# Patient Record
Sex: Female | Born: 1945 | Race: White | Hispanic: No | Marital: Single
Health system: Southern US, Community
[De-identification: ages and names within clinical notes are randomized; demographics above are authoritative.]

---

## 2011-11-13 ENCOUNTER — Other Ambulatory Visit (HOSPITAL_COMMUNITY): Payer: Self-pay | Admitting: Internal Medicine

## 2011-11-13 ENCOUNTER — Ambulatory Visit (HOSPITAL_COMMUNITY)
Admission: RE | Admit: 2011-11-13 | Discharge: 2011-11-13 | Disposition: A | Discharge status: Home / Self Care | Payer: Medicare Other | Source: Ambulatory Visit | Attending: Internal Medicine | Admitting: Internal Medicine

## 2011-11-13 VITALS — BP 98/70 | HR 106 | Temp 98.6°F | Resp 20

## 2011-11-13 DIAGNOSIS — K632 Fistula of intestine: Secondary | ICD-10-CM

## 2011-11-13 DIAGNOSIS — C539 Malignant neoplasm of cervix uteri, unspecified: Secondary | ICD-10-CM | POA: Insufficient documentation

## 2011-11-13 DIAGNOSIS — N1330 Unspecified hydronephrosis: Secondary | ICD-10-CM | POA: Insufficient documentation

## 2011-11-13 DIAGNOSIS — N135 Crossing vessel and stricture of ureter without hydronephrosis: Secondary | ICD-10-CM | POA: Insufficient documentation

## 2011-11-13 DIAGNOSIS — C801 Malignant (primary) neoplasm, unspecified: Secondary | ICD-10-CM | POA: Insufficient documentation

## 2011-11-13 LAB — GLUCOSE, CAPILLARY
Glucose-Capillary: 72 mg/dL (ref 70–99)
Glucose-Capillary: 74 mg/dL (ref 70–99)

## 2011-11-13 MED ORDER — MIDAZOLAM HCL 5 MG/5ML IJ SOLN
INTRAMUSCULAR | Status: AC | PRN
  Administered 2011-11-13: 0.5 mg via INTRAVENOUS
  Administered 2011-11-13: 1 mg via INTRAVENOUS
  Administered 2011-11-13 (×2): 0.5 mg via INTRAVENOUS
  Administered 2011-11-13 (×2): 1 mg via INTRAVENOUS

## 2011-11-13 MED ORDER — FENTANYL CITRATE 0.05 MG/ML IJ SOLN
INTRAMUSCULAR | Status: AC | PRN
  Administered 2011-11-13: 25 ug via INTRAVENOUS
  Administered 2011-11-13: 12.5 ug via INTRAVENOUS
  Administered 2011-11-13: 25 ug via INTRAVENOUS
  Administered 2011-11-13: 12.5 ug via INTRAVENOUS
  Administered 2011-11-13: 25 ug via INTRAVENOUS

## 2011-11-13 MED ORDER — HYDROMORPHONE HCL PF 1 MG/ML IJ SOLN
INTRAMUSCULAR | Status: AC | PRN
  Administered 2011-11-13: 1 mg

## 2011-11-13 MED ORDER — DEXTROSE 5 % IV SOLN
INTRAVENOUS | Status: DC
  Administered 2011-11-13: 12:00:00 via INTRAVENOUS

## 2011-11-13 MED ORDER — HYDROMORPHONE HCL PF 1 MG/ML IJ SOLN
INTRAMUSCULAR | Status: AC
  Filled 2011-11-13: qty 1

## 2011-11-13 MED ORDER — MIDAZOLAM HCL 2 MG/2ML IJ SOLN
INTRAMUSCULAR | Status: AC
  Filled 2011-11-13: qty 4

## 2011-11-13 MED ORDER — DEXTROSE-NACL 5-0.45 % IV SOLN: INTRAVENOUS | Status: DC

## 2011-11-13 MED ORDER — SODIUM CHLORIDE 0.9 % IV SOLN
1250.0000 mg | INTRAVENOUS | Status: AC
  Administered 2011-11-13: 1250 mg via INTRAVENOUS
  Filled 2011-11-13: qty 1250

## 2011-11-13 MED ORDER — MIDAZOLAM HCL 2 MG/2ML IJ SOLN
INTRAMUSCULAR | Status: AC
  Filled 2011-11-13: qty 2

## 2011-11-13 MED ORDER — FENTANYL CITRATE 0.05 MG/ML IJ SOLN
INTRAMUSCULAR | Status: AC
  Filled 2011-11-13: qty 4

## 2011-11-13 MED ORDER — HYDROMORPHONE HCL PF 1 MG/ML IJ SOLN
1.0000 mg | Freq: Once | INTRAMUSCULAR | Status: AC
  Administered 2011-11-13: 1 mg via INTRAVENOUS

## 2011-11-13 MED ORDER — IOHEXOL 300 MG/ML  SOLN
50.0000 mL | Freq: Once | INTRAMUSCULAR | Status: AC | PRN
  Administered 2011-11-13: 15 mL

## 2011-11-13 NOTE — H&P (Signed)
Lori Chaney is an 66 y.o. female.   Chief Complaint: "my left kidney tube fell out" HPI: Patient with hx of metastatic cervical carcinoma and bilateral hydronephrosis requiring bilateral nephrostomies at [facility] presents today for replacement of left nephrostomy tube secondary to inadvertent removal while turning in bed.  PMH: Cervical cancer, DJD, staph bacteremia, CRI, protein calorie malnutrition PSH: APPY, D&C, Hyst with bilat salpingo -oophorectomy, vaginectomy, cystectomy, ileal conduit, omental flap Social History:  does not have a smoking history on file. She does not have any smokeless tobacco history on file. Her alcohol and drug histories not on file. ON:GEXBMW with breast cancer Allergies: No Known Allergies  Current outpatient prescriptions:acetaminophen (TYLENOL) 325 MG tablet, Take 650 mg by mouth every 6 (six) hours as needed. For pain, Disp: , Rfl: ;  cetirizine (ZYRTEC) 10 MG tablet, Take 10 mg by mouth daily., Disp: , Rfl: ;  diphenhydrAMINE (BENADRYL) 50 MG/ML injection, Inject 25 mg into the vein every 6 (six) hours as needed. For allergies / itching, Disp: , Rfl:  enoxaparin (LOVENOX) 40 MG/0.4ML injection, Inject 40 mg into the skin daily., Disp: , Rfl: ;  fentaNYL (DURAGESIC - DOSED MCG/HR) 75 MCG/HR, Place 1 patch onto the skin every 3 (three) days., Disp: , Rfl: ;  gentamicin (GARAMYCIN) 0.6-0.9 MG/ML-%, Inject 60 mg into the vein daily., Disp: , Rfl: ;  HYDROmorphone (DILAUDID) 1 MG/ML injection, Inject 1 mg into the vein every 4 (four) hours as needed. For pain, Disp: , Rfl:  insulin glargine (LANTUS) 100 UNIT/ML injection, Inject 35 Units into the skin at bedtime., Disp: , Rfl: ;  insulin regular (NOVOLIN R,HUMULIN R) 100 units/mL injection, Inject into the skin 3 (three) times daily before meals. Sliding Scale, Disp: , Rfl: ;  lidocaine (LIDODERM) 5 %, Place 1 patch onto the skin daily. Remove & Discard patch within 12 hours or as directed by MD, Disp: , Rfl:  loperamide  (IMODIUM) 2 MG capsule, Take 4 mg by mouth every 6 (six) hours as needed. For Diarrhea, Disp: , Rfl: ;  LORazepam (ATIVAN) 0.5 MG tablet, Take 0.5 mg by mouth every 6 (six) hours as needed. For anxiety, Disp: , Rfl: ;  Menthol, Topical Analgesic, (BENGAY EX), Apply 1 application topically every 12 (twelve) hours., Disp: , Rfl: ;  mirtazapine (REMERON) 15 MG tablet, Take 15 mg by mouth at bedtime., Disp: , Rfl:  ondansetron (ZOFRAN) 4 MG/2ML SOLN injection, Inject 4 mg into the vein every 6 (six) hours as needed. For nausea, Disp: , Rfl: ;  PANTOPRAZOLE SODIUM IV, Inject 40 mg into the vein every 12 (twelve) hours., Disp: , Rfl: ;  promethazine (PHENERGAN) 25 MG/ML injection, Inject 25 mg into the vein every 6 (six) hours as needed. For nausea, Disp: , Rfl:  sodium chloride 0.9 % SOLN 250 mL with vancomycin 1000 MG SOLR, Inject 1,250 mg into the vein daily., Disp: , Rfl: ;  TPN ADULT, Inject 2,000 mLs into the vein continuous. 36ml/hr   q24h, Disp: , Rfl:   No results found for this or any previous visit (from the past 48 hour(s)). No results found.  Review of Systems  Constitutional: Negative for fever and chills.  Respiratory: Negative for cough and shortness of breath.   Cardiovascular: Negative for chest pain.  Gastrointestinal: Positive for abdominal pain.  Musculoskeletal: Positive for back pain.       Bilat shoulder pain  Neurological: Negative for headaches.  Endo/Heme/Allergies: Does not bruise/bleed easily.  Psychiatric/Behavioral: The patient is nervous/anxious.  Blood pressure 111/66, pulse 105, temperature 98.6 F (37 C), resp. rate 20, SpO2 99.00%. Physical Exam  Constitutional: She is oriented to person, place, and time. She appears well-developed and well-nourished.  Cardiovascular:       Tachycardic, but reg rhythm  Respiratory: Effort normal.       Dim BS bases  GI:       Obese; ileal conduit/ostomy intact; right PCN intact with old crusted dressing around insertion  site, yellow urine in bag; left flank with intact gauze dressing  Musculoskeletal: Normal range of motion. She exhibits no edema.  Neurological: She is alert and oriented to person, place, and time.     Assessment/Plan Patient with metastatic cervical carcinoma, bilateral hydronephrosis and recent inadvertent removal of left nephrostomy tube ; plan is for replacement of left nephrostomy today. Details /risks of procedure d/w pt with her understanding and consent.   Lori Chaney,D KEVIN 11/13/2011, 11:06 AM

## 2011-11-13 NOTE — ED Notes (Signed)
PTAR here, report called to [facility].  Pts pain better.

## 2013-09-15 ENCOUNTER — Other Ambulatory Visit (HOSPITAL_COMMUNITY): Payer: Self-pay | Admitting: Internal Medicine

## 2013-09-15 DIAGNOSIS — N183 Chronic kidney disease, stage 3 unspecified: Secondary | ICD-10-CM

## 2013-09-15 DIAGNOSIS — C539 Malignant neoplasm of cervix uteri, unspecified: Secondary | ICD-10-CM

## 2013-09-22 ENCOUNTER — Other Ambulatory Visit (HOSPITAL_COMMUNITY): Payer: Medicare Other

## 2013-11-03 ENCOUNTER — Inpatient Hospital Stay
Admission: AD | Admit: 2013-11-03 | Discharge: 2013-11-24 | Disposition: A | Discharge status: Home Health | Payer: Self-pay | Source: Ambulatory Visit | Attending: Internal Medicine | Admitting: Internal Medicine

## 2013-11-03 ENCOUNTER — Other Ambulatory Visit (HOSPITAL_COMMUNITY): Payer: Self-pay

## 2013-11-04 ENCOUNTER — Other Ambulatory Visit (HOSPITAL_COMMUNITY): Payer: Self-pay

## 2013-11-04 LAB — COMPREHENSIVE METABOLIC PANEL
ALT: <5 U/L (ref 0–35)
ANION GAP: 11 (ref 5–15)
AST: 14 U/L (ref 0–37)
Albumin: 0.9 g/dL — ABNORMAL LOW (ref 3.5–5.2)
Alkaline Phosphatase: 91 U/L (ref 39–117)
BUN: 13 mg/dL (ref 6–23)
CALCIUM: 7.9 mg/dL — AB (ref 8.4–10.5)
CO2: 19 mEq/L (ref 19–32)
CREATININE: 0.77 mg/dL (ref 0.50–1.10)
Chloride: 113 mEq/L — ABNORMAL HIGH (ref 96–112)
GFR, EST NON AFRICAN AMERICAN: 85 mL/min — AB (ref >90)
GLUCOSE: 71 mg/dL (ref 70–99)
Potassium: 3.9 mEq/L (ref 3.7–5.3)
SODIUM: 143 meq/L (ref 137–147)
TOTAL PROTEIN: 4.9 g/dL — AB (ref 6.0–8.3)
Total Bilirubin: 0.2 mg/dL — ABNORMAL LOW (ref 0.3–1.2)

## 2013-11-04 LAB — APTT: APTT: 38 s — AB (ref 24–37)

## 2013-11-04 LAB — CBC
HCT: 28.9 % — ABNORMAL LOW (ref 36.0–46.0)
Hemoglobin: 9.1 g/dL — ABNORMAL LOW (ref 12.0–15.0)
MCH: 28.4 pg (ref 26.0–34.0)
MCHC: 31.5 g/dL (ref 30.0–36.0)
MCV: 90.3 fL (ref 78.0–100.0)
PLATELETS: 160 10*3/uL (ref 150–400)
RBC: 3.2 MIL/uL — AB (ref 3.87–5.11)
RDW: 17.1 % — ABNORMAL HIGH (ref 11.5–15.5)
WBC: 5.4 10*3/uL (ref 4.0–10.5)

## 2013-11-04 LAB — MAGNESIUM: Magnesium: 1.6 mg/dL (ref 1.5–2.5)

## 2013-11-04 LAB — TSH: TSH: 8.64 u[IU]/mL — ABNORMAL HIGH (ref 0.350–4.500)

## 2013-11-04 LAB — LACTIC ACID, PLASMA: LACTIC ACID, VENOUS: 1.9 mmol/L (ref 0.5–2.2)

## 2013-11-04 LAB — PROCALCITONIN: Procalcitonin: 0.33 ng/mL

## 2013-11-04 LAB — PHOSPHORUS: PHOSPHORUS: 3.5 mg/dL (ref 2.3–4.6)

## 2013-11-04 LAB — PROTIME-INR
INR: 1.2 (ref 0.00–1.49)
Prothrombin Time: 15.2 seconds (ref 11.6–15.2)

## 2013-11-04 MED ORDER — IOHEXOL 300 MG/ML  SOLN
50.0000 mL | Freq: Once | INTRAMUSCULAR | Status: AC | PRN
  Administered 2013-11-04: 35 mL via ORAL

## 2013-11-04 NOTE — Progress Notes (Addendum)
Subjective: Pt is alert and oriented she states her pain is not controlled well with current medication, otherwise she denies CP, SOA, N/V OR ABD PAIN.  She has anorexia.  Objective:  Tmax:  100.85F HR:  99-109 RR:  18-20 BP:  104/71-160/90 Pulse Ox:  98-99% RA I/O:  2590/1690 BM: 0 BG:  88   GEN: ill appearing, A&O x 4, in NAD HEENT: PERRLA EOMI bilaterally, Mild dry oral mucosa, +gag reflex, sclerae anicteric bilaterally NECK: Supple, no LAD or JVD LUNGS: Symmetrical chest rise, diminished bibasilar BS otherwise CTAb CV: Normal S1, S2, no m/c/r ABD: S/NT/ND +BS x 4. No rebound, guarding, or peritoneal signs noted. + PEG tube placement and Ileal Conduit RLQ wth adequate output EXT: No CCE, pulses 2+ NEURO: Grossly intact w/o focal deficits, SMAEs, DTRs 2+ PSYCH: Flat affect  Results for orders placed during the hospital encounter of 11/03/13 (from the past 24 hour(s))  TSH     Status: Abnormal   Collection Time    11/04/13  9:05 AM      Result Value Ref Range   TSH 8.640 (*) 0.350 - 4.500 uIU/mL  APTT     Status: Abnormal   Collection Time    11/04/13  9:05 AM      Result Value Ref Range   aPTT 38 (*) 24 - 37 seconds  PROTIME-INR     Status: None   Collection Time    11/04/13  9:05 AM      Result Value Ref Range   Prothrombin Time 15.2  11.6 - 15.2 seconds   INR 1.20  0.00 - 1.49  COMPREHENSIVE METABOLIC PANEL     Status: Abnormal   Collection Time    11/04/13  9:05 AM      Result Value Ref Range   Sodium 143  137 - 147 mEq/L   Potassium 3.9  3.7 - 5.3 mEq/L   Chloride 113 (*) 96 - 112 mEq/L   CO2 19  19 - 32 mEq/L   Glucose, Bld 71  70 - 99 mg/dL   BUN 13  6 - 23 mg/dL   Creatinine, Ser 0.77  0.50 - 1.10 mg/dL   Calcium 7.9 (*) 8.4 - 10.5 mg/dL   Total Protein 4.9 (*) 6.0 - 8.3 g/dL   Albumin 0.9 (*) 3.5 - 5.2 g/dL   AST 14  0 - 37 U/L   ALT <5  0 - 35 U/L   Alkaline Phosphatase 91  39 - 117 U/L   Total Bilirubin 0.2 (*) 0.3 - 1.2 mg/dL   GFR calc non Af  Amer 85 (*) >90 mL/min   GFR calc Af Amer >90  >90 mL/min   Anion gap 11  5 - 15  CBC     Status: Abnormal   Collection Time    11/04/13  9:05 AM      Result Value Ref Range   WBC 5.4  4.0 - 10.5 K/uL   RBC 3.20 (*) 3.87 - 5.11 MIL/uL   Hemoglobin 9.1 (*) 12.0 - 15.0 g/dL   HCT 28.9 (*) 36.0 - 46.0 %   MCV 90.3  78.0 - 100.0 fL   MCH 28.4  26.0 - 34.0 pg   MCHC 31.5  30.0 - 36.0 g/dL   RDW 17.1 (*) 11.5 - 15.5 %   Platelets 160  150 - 400 K/uL  MAGNESIUM     Status: None   Collection Time    11/04/13  9:05 AM  Result Value Ref Range   Magnesium 1.6  1.5 - 2.5 mg/dL  PHOSPHORUS     Status: None   Collection Time    11/04/13  9:05 AM      Result Value Ref Range   Phosphorus 3.5  2.3 - 4.6 mg/dL  PROCALCITONIN     Status: None   Collection Time    11/04/13  9:05 AM      Result Value Ref Range   Procalcitonin 0.33    LACTIC ACID, PLASMA     Status: None   Collection Time    11/04/13  9:05 AM      Result Value Ref Range   Lactic Acid, Venous 1.9  0.5 - 2.2 mmol/L    Studies/Results: Dg Chest Port 1 View  11/04/2013   CLINICAL DATA:  PICC line placement  EXAM: PORTABLE CHEST - 1 VIEW  COMPARISON:  10/20/2013  FINDINGS: Right arm PICC tip in the SVC at the cavoatrial junction in good position.  Bibasilar atelectasis has increased from the prior study. Small pleural effusions. Negative for heart failure.  IMPRESSION: PICC tip at the cavoatrial junction in good position  Increased bibasilar atelectasis and small pleural effusions.   Electronically Signed   By: Franchot Gallo M.D.   On: 11/04/2013 08:03   Dg Abd Portable 1v  11/03/2013   CLINICAL DATA:  Check PEG placement  EXAM: PORTABLE ABDOMEN - 1 VIEW  COMPARISON:  CT abdomen pelvis dated 10/27/2013  FINDINGS: Contrast administered via percutaneous gastrostomy tube opacifies the stomach and proximal small bowel, confirming intraluminal placement.  Bilateral percutaneous nephrostomy catheters.  Nonobstructive bowel gas pattern.   IMPRESSION: Contrast administered via percutaneous gastrostomy tube opacifies the stomach, confirming intraluminal placement.   Electronically Signed   By: Julian Hy M.D.   On: 11/03/2013 21:55   Assessment/Plan: Sepsis with Bacteremia: Hypotension, refractory, likely secondary to Sepsis:  -normal lactic acid and low pro-calcitonin level, concern that hypotension may be 2/2 neoplastic process given end stage and TNF action, if ID w/u benign -ID Consult, repeat BC x 2, pCXR, and UA with C and S, if needed consider above and pt will need to have the discussion of palliative care nevertheless and discuss maintenance and discharge tx plan. -Pt has complete abx course PTA at OSH, however may need to empirically resume abx if continued refractory hypotension. -continue to titrate Levophed to goals and titrate to off as soon able.  -prn lab survey  End Stage Cervical Cancer/Chronic Pain Syndrome secondary to neoplastic process: -titrate pain med to goal and tolerance, pt PTA opiate regimen more aggressive than noted upon transfer so will make attempts to resume home pain medications and adjust as needed -pt should be aligned with palliative care/hospice given end-stage cervical cancer, severe pain needs, and increase morbidity that is resulting as a result of her immunocompromised state, however, she has adamantly refused both despite extensive conversations at OSH and a note was placed on d/c summary to that patient stated, "DO NOT MENTION HOSPICE TO [place]."  Therefore, we will make attempts to resolve acute sepsis and manage co-morbid disease states but her prognosis remains grim.  PVD with h/o Pulmonary Embolism: -not sure why pt is not on chronic anticoagulation therapy, esp with neoplasm  Hypothyroidism: -TSH on admission elevated at 8.640, started on Synthroid 7mcg PO daily and recommend recheck in 4 weeks and adjust dose accordingly.  Hyperlipidemia: -continue statin for now  and monitor LFTs and CPK prn, also monitor for  clinical ADRs associated with statins  Anemia of Chronic Disease, Normocytic Normochromic: -monitor for s/s of acute bleed  Generalized Weakness: -PT/OT eval and tx.  Pt is amotivated and may benefit from SSRI therapy, re-evaluate need about a week.  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 Pt has a REGULAR diet and also requires Bolus feeds with Jevity TID, will likely require continuous TF as she refuses to eat even when directed and assisted. May benefit from appetite stimulant, reassess after resolution of sepsis.  GERD/GI proph: pepcid BID VTE proph: heparin 5000U SQ q8h, question need for therapeutic anticoagulation given PVD and malignancy,however pt was not on coumadin or therapeutic lovenox on admission   LOS: 1 day   Howis Y Nordstrom

## 2013-11-07 LAB — BASIC METABOLIC PANEL
ANION GAP: 9 (ref 5–15)
BUN: 14 mg/dL (ref 6–23)
CO2: 23 mEq/L (ref 19–32)
Calcium: 8.5 mg/dL (ref 8.4–10.5)
Chloride: 116 mEq/L — ABNORMAL HIGH (ref 96–112)
Creatinine, Ser: 0.79 mg/dL (ref 0.50–1.10)
GFR calc Af Amer: >90 mL/min (ref >90)
GFR, EST NON AFRICAN AMERICAN: 84 mL/min — AB (ref >90)
GLUCOSE: 127 mg/dL — AB (ref 70–99)
Potassium: 3.4 mEq/L — ABNORMAL LOW (ref 3.7–5.3)
SODIUM: 148 meq/L — AB (ref 137–147)

## 2013-11-07 LAB — PHOSPHORUS: Phosphorus: 3.7 mg/dL (ref 2.3–4.6)

## 2013-11-07 LAB — CBC
HCT: 27.2 % — ABNORMAL LOW (ref 36.0–46.0)
HEMOGLOBIN: 8.6 g/dL — AB (ref 12.0–15.0)
MCH: 28.4 pg (ref 26.0–34.0)
MCHC: 31.6 g/dL (ref 30.0–36.0)
MCV: 89.8 fL (ref 78.0–100.0)
Platelets: 172 10*3/uL (ref 150–400)
RBC: 3.03 MIL/uL — AB (ref 3.87–5.11)
RDW: 17.3 % — ABNORMAL HIGH (ref 11.5–15.5)
WBC: 6.8 10*3/uL (ref 4.0–10.5)

## 2013-11-07 LAB — MAGNESIUM: MAGNESIUM: 1.7 mg/dL (ref 1.5–2.5)

## 2013-11-09 ENCOUNTER — Other Ambulatory Visit (HOSPITAL_COMMUNITY): Payer: Self-pay

## 2013-11-09 LAB — BASIC METABOLIC PANEL
ANION GAP: 9 (ref 5–15)
Anion gap: 7 (ref 5–15)
BUN: 13 mg/dL (ref 6–23)
BUN: 13 mg/dL (ref 6–23)
CALCIUM: 8.2 mg/dL — AB (ref 8.4–10.5)
CHLORIDE: 115 meq/L — AB (ref 96–112)
CO2: 26 meq/L (ref 19–32)
CO2: 24 meq/L (ref 19–32)
CREATININE: 0.74 mg/dL (ref 0.50–1.10)
CREATININE: 0.72 mg/dL (ref 0.50–1.10)
Calcium: 8.5 mg/dL (ref 8.4–10.5)
Chloride: 116 mEq/L — ABNORMAL HIGH (ref 96–112)
GFR calc Af Amer: >90 mL/min (ref >90)
GFR calc Af Amer: >90 mL/min (ref >90)
GFR calc non Af Amer: 86 mL/min — ABNORMAL LOW (ref >90)
GFR calc non Af Amer: 87 mL/min — ABNORMAL LOW (ref >90)
GLUCOSE: 110 mg/dL — AB (ref 70–99)
Glucose, Bld: 104 mg/dL — ABNORMAL HIGH (ref 70–99)
Potassium: 4.6 mEq/L (ref 3.7–5.3)
Potassium: 4.9 mEq/L (ref 3.7–5.3)
SODIUM: 149 meq/L — AB (ref 137–147)
Sodium: 148 mEq/L — ABNORMAL HIGH (ref 137–147)

## 2013-11-09 LAB — URINALYSIS, ROUTINE W REFLEX MICROSCOPIC
Bilirubin Urine: NEGATIVE
Bilirubin Urine: NEGATIVE
GLUCOSE, UA: NEGATIVE mg/dL
Glucose, UA: NEGATIVE mg/dL
KETONES UR: NEGATIVE mg/dL
Ketones, ur: NEGATIVE mg/dL
NITRITE: NEGATIVE
NITRITE: POSITIVE — AB
PROTEIN: NEGATIVE mg/dL
Protein, ur: NEGATIVE mg/dL
Specific Gravity, Urine: 1.007 (ref 1.005–1.030)
Specific Gravity, Urine: 1.008 (ref 1.005–1.030)
UROBILINOGEN UA: 0.2 mg/dL (ref 0.0–1.0)
UROBILINOGEN UA: 0.2 mg/dL (ref 0.0–1.0)
pH: 6 (ref 5.0–8.0)
pH: 6.5 (ref 5.0–8.0)

## 2013-11-09 LAB — CBC WITH DIFFERENTIAL/PLATELET
BASOS ABS: 0 10*3/uL (ref 0.0–0.1)
Basophils Relative: 0 % (ref 0–1)
EOS ABS: 0.2 10*3/uL (ref 0.0–0.7)
EOS PCT: 3 % (ref 0–5)
HEMATOCRIT: 25.6 % — AB (ref 36.0–46.0)
Hemoglobin: 7.9 g/dL — ABNORMAL LOW (ref 12.0–15.0)
Lymphocytes Relative: 32 % (ref 12–46)
Lymphs Abs: 2.5 10*3/uL (ref 0.7–4.0)
MCH: 28.2 pg (ref 26.0–34.0)
MCHC: 30.9 g/dL (ref 30.0–36.0)
MCV: 91.4 fL (ref 78.0–100.0)
Monocytes Absolute: 1.2 10*3/uL — ABNORMAL HIGH (ref 0.1–1.0)
Monocytes Relative: 15 % — ABNORMAL HIGH (ref 3–12)
Neutro Abs: 3.8 10*3/uL (ref 1.7–7.7)
Neutrophils Relative %: 50 % (ref 43–77)
PLATELETS: 188 10*3/uL (ref 150–400)
RBC: 2.8 MIL/uL — ABNORMAL LOW (ref 3.87–5.11)
RDW: 17.1 % — AB (ref 11.5–15.5)
WBC: 7.6 10*3/uL (ref 4.0–10.5)

## 2013-11-09 LAB — URINE MICROSCOPIC-ADD ON

## 2013-11-09 LAB — MAGNESIUM
MAGNESIUM: 2 mg/dL (ref 1.5–2.5)
Magnesium: 1.9 mg/dL (ref 1.5–2.5)

## 2013-11-09 LAB — PHOSPHORUS: PHOSPHORUS: 2.9 mg/dL (ref 2.3–4.6)

## 2013-11-09 NOTE — Progress Notes (Signed)
Subjective: Pt is alert and oriented pain now controlled well with current medication, otherwise she denies CP, SOA, N/V OR ABD PAIN.    Objective:  Tmax:  97.6 HR:  90 RR:  17 BP:  102/66 Pulse Ox:  98% I/O:  -910 Ileal conduit noted   GEN: ill appearing, A&O x 4, in NAD HEENT: PERRLA EOMI bilaterally, Mild dry oral mucosa, +gag reflex, sclerae anicteric bilaterally NECK: Supple, no LAD or JVD LUNGS: Symmetrical chest rise, diminished bibasilar BS otherwise CTAb CV: Normal S1, S2, no m/c/r ABD: S/NT/ND +BS x 4. No rebound, guarding, or peritoneal signs noted. + PEG tube placement and Ileal Conduit RLQ wth adequate output Nephrostomy sites red and look infected EXT: No CCE, pulses 2+ NEURO: Grossly intact w/o focal deficits, SMAEs, DTRs 2+ PSYCH: Flat affect  Results for orders placed during the hospital encounter of 11/03/13 (from the past 24 hour(s))  BASIC METABOLIC PANEL     Status: Abnormal   Collection Time    11/09/13  7:18 AM      Result Value Ref Range   Sodium 149 (*) 137 - 147 mEq/L   Potassium 4.6  3.7 - 5.3 mEq/L   Chloride 116 (*) 96 - 112 mEq/L   CO2 26  19 - 32 mEq/L   Glucose, Bld 110 (*) 70 - 99 mg/dL   BUN 13  6 - 23 mg/dL   Creatinine, Ser 0.74  0.50 - 1.10 mg/dL   Calcium 8.2 (*) 8.4 - 10.5 mg/dL   GFR calc non Af Amer 86 (*) >90 mL/min   GFR calc Af Amer >90  >90 mL/min   Anion gap 7  5 - 15  MAGNESIUM     Status: None   Collection Time    11/09/13  7:18 AM      Result Value Ref Range   Magnesium 1.9  1.5 - 2.5 mg/dL  CBC WITH DIFFERENTIAL     Status: Abnormal   Collection Time    11/09/13 11:42 AM      Result Value Ref Range   WBC 7.6  4.0 - 10.5 K/uL   RBC 2.80 (*) 3.87 - 5.11 MIL/uL   Hemoglobin 7.9 (*) 12.0 - 15.0 g/dL   HCT 25.6 (*) 36.0 - 46.0 %   MCV 91.4  78.0 - 100.0 fL   MCH 28.2  26.0 - 34.0 pg   MCHC 30.9  30.0 - 36.0 g/dL   RDW 17.1 (*) 11.5 - 15.5 %   Platelets 188  150 - 400 K/uL   Neutrophils Relative % 50  43 - 77 %   Neutro Abs 3.8  1.7 - 7.7 K/uL   Lymphocytes Relative 32  12 - 46 %   Lymphs Abs 2.5  0.7 - 4.0 K/uL   Monocytes Relative 15 (*) 3 - 12 %   Monocytes Absolute 1.2 (*) 0.1 - 1.0 K/uL   Eosinophils Relative 3  0 - 5 %   Eosinophils Absolute 0.2  0.0 - 0.7 K/uL   Basophils Relative 0  0 - 1 %   Basophils Absolute 0.0  0.0 - 0.1 K/uL  BASIC METABOLIC PANEL     Status: Abnormal   Collection Time    11/09/13 11:42 AM      Result Value Ref Range   Sodium 148 (*) 137 - 147 mEq/L   Potassium 4.9  3.7 - 5.3 mEq/L   Chloride 115 (*) 96 - 112 mEq/L   CO2 24  19 -  32 mEq/L   Glucose, Bld 104 (*) 70 - 99 mg/dL   BUN 13  6 - 23 mg/dL   Creatinine, Ser 0.72  0.50 - 1.10 mg/dL   Calcium 8.5  8.4 - 10.5 mg/dL   GFR calc non Af Amer 87 (*) >90 mL/min   GFR calc Af Amer >90  >90 mL/min   Anion gap 9  5 - 15  MAGNESIUM     Status: None   Collection Time    11/09/13 11:42 AM      Result Value Ref Range   Magnesium 2.0  1.5 - 2.5 mg/dL  PHOSPHORUS     Status: None   Collection Time    11/09/13 11:42 AM      Result Value Ref Range   Phosphorus 2.9  2.3 - 4.6 mg/dL  URINALYSIS, ROUTINE W REFLEX MICROSCOPIC     Status: Abnormal   Collection Time    11/09/13  4:08 PM      Result Value Ref Range   Color, Urine YELLOW  YELLOW   APPearance CLOUDY (*) CLEAR   Specific Gravity, Urine 1.008  1.005 - 1.030   pH 6.0  5.0 - 8.0   Glucose, UA NEGATIVE  NEGATIVE mg/dL   Hgb urine dipstick MODERATE (*) NEGATIVE   Bilirubin Urine NEGATIVE  NEGATIVE   Ketones, ur NEGATIVE  NEGATIVE mg/dL   Protein, ur NEGATIVE  NEGATIVE mg/dL   Urobilinogen, UA 0.2  0.0 - 1.0 mg/dL   Nitrite NEGATIVE  NEGATIVE   Leukocytes, UA LARGE (*) NEGATIVE  URINE MICROSCOPIC-ADD ON     Status: Abnormal   Collection Time    11/09/13  4:08 PM      Result Value Ref Range   WBC, UA 21-50  <3 WBC/hpf   RBC / HPF 7-10  <3 RBC/hpf   Bacteria, UA MANY (*) RARE   Urine-Other MANY YEAST    URINALYSIS, ROUTINE W REFLEX MICROSCOPIC      Status: Abnormal   Collection Time    11/09/13  4:10 PM      Result Value Ref Range   Color, Urine YELLOW  YELLOW   APPearance CLOUDY (*) CLEAR   Specific Gravity, Urine 1.007  1.005 - 1.030   pH 6.5  5.0 - 8.0   Glucose, UA NEGATIVE  NEGATIVE mg/dL   Hgb urine dipstick MODERATE (*) NEGATIVE   Bilirubin Urine NEGATIVE  NEGATIVE   Ketones, ur NEGATIVE  NEGATIVE mg/dL   Protein, ur NEGATIVE  NEGATIVE mg/dL   Urobilinogen, UA 0.2  0.0 - 1.0 mg/dL   Nitrite POSITIVE (*) NEGATIVE   Leukocytes, UA LARGE (*) NEGATIVE  URINE MICROSCOPIC-ADD ON     Status: Abnormal   Collection Time    11/09/13  4:10 PM      Result Value Ref Range   WBC, UA 21-50  <3 WBC/hpf   RBC / HPF 7-10  <3 RBC/hpf   Bacteria, UA MANY (*) RARE    Studies/Results: Dg Chest Port 1 View  11/04/2013   CLINICAL DATA:  PICC line placement  EXAM: PORTABLE CHEST - 1 VIEW  COMPARISON:  10/20/2013  FINDINGS: Right arm PICC tip in the SVC at the cavoatrial junction in good position.  Bibasilar atelectasis has increased from the prior study. Small pleural effusions. Negative for heart failure.  IMPRESSION: PICC tip at the cavoatrial junction in good position  Increased bibasilar atelectasis and small pleural effusions.   Electronically Signed   By: Franchot Gallo M.D.  On: 11/04/2013 08:03   Dg Abd Portable 1v  11/03/2013   CLINICAL DATA:  Check PEG placement  EXAM: PORTABLE ABDOMEN - 1 VIEW  COMPARISON:  CT abdomen pelvis dated 10/27/2013  FINDINGS: Contrast administered via percutaneous gastrostomy tube opacifies the stomach and proximal small bowel, confirming intraluminal placement.  Bilateral percutaneous nephrostomy catheters.  Nonobstructive bowel gas pattern.  IMPRESSION: Contrast administered via percutaneous gastrostomy tube opacifies the stomach, confirming intraluminal placement.   Electronically Signed   By: Julian Hy M.D.   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 Because of care discussed with patient PVD with h/o Pulmonary Embolism: -not sure why pt is not on chronic anticoagulation therapy, esp with neoplasm Hypothyroidism: Continue treatment. Hyperlipidemia: -continue statin for now and monitor LFTs and CPK prn, also monitor for clinical ADRs associated with statins -no c/o and LFTs wnl to date Anemia of Chronic Disease, Normocytic Normochromic: -monitor for s/s of acute bleed, none noted this exam Generalized Weakness: -PT/OT eval and tx.  Pt is amotivated and may benefit from SSRI therapy, re-evaluate need about a week. -Should be UTC for meals at a minimum as she chooses to stay in bed in supine position  -d/w sister Suanne Marker) and she states that patient curses them when they try to make her eat or get her out of bed, explained to sister (whom is a CNA) that pt has high risk for skin breakdown and pneumonia. Sister states that she and her other sister (MPOA and LPN) reinforced this concept to patient each time they see her to no avail. 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 Pt now on continuous TFs being that she refuses to eat even when directed and assisted. May benefit from appetite stimulant (pt has been on Megace in the past per sister), reassess after resolution of sepsis. GERD/GI proph: pepcid BID VTE proph: heparin 5000U SQ q8h, question need for therapeutic anticoagulation given PVD and malignancy,however pt was not on coumadin or therapeutic lovenox on admission  Code Status: Full Prognosis: Grim   LOS: 6 days   Merton Border

## 2013-11-09 NOTE — Progress Notes (Signed)
Subjective: Pt is alert and oriented pain now controlled well with current medication, otherwise she denies CP, SOA, N/V OR ABD PAIN.    Objective:  Tmax:  98.22F HR:  68-104 RR:  11-20 BP:  80/58-106/66 on Levophed Pulse Ox:  94-98% RA I/O:  2402/1200 BM: 1 BG:  103-117 LINES: RUA PICC FOLEY: none, Ileal Conduit   GEN: ill appearing, A&O x 4, in NAD HEENT: PERRLA EOMI bilaterally, Mild dry oral mucosa, +gag reflex, sclerae anicteric bilaterally NECK: Supple, no LAD or JVD LUNGS: Symmetrical chest rise, diminished bibasilar BS otherwise CTAb CV: Normal S1, S2, no m/c/r ABD: S/NT/ND +BS x 4. No rebound, guarding, or peritoneal signs noted. + PEG tube placement and Ileal Conduit RLQ wth adequate output EXT: No CCE, pulses 2+ NEURO: Grossly intact w/o focal deficits, SMAEs, DTRs 2+ PSYCH: Flat affect  No results found for this or any previous visit (from the past 24 hour(s)).  Labs Reviewed: tx plan adjusted accordingly  Studies/Results: Dg Chest Port 1 View  11/04/2013   CLINICAL DATA:  PICC line placement  EXAM: PORTABLE CHEST - 1 VIEW  COMPARISON:  10/20/2013  FINDINGS: Right arm PICC tip in the SVC at the cavoatrial junction in good position.  Bibasilar atelectasis has increased from the prior study. Small pleural effusions. Negative for heart failure.  IMPRESSION: PICC tip at the cavoatrial junction in good position  Increased bibasilar atelectasis and small pleural effusions.   Electronically Signed   By: Franchot Gallo M.D.   On: 11/04/2013 08:03   Dg Abd Portable 1v  11/03/2013   CLINICAL DATA:  Check PEG placement  EXAM: PORTABLE ABDOMEN - 1 VIEW  COMPARISON:  CT abdomen pelvis dated 10/27/2013  FINDINGS: Contrast administered via percutaneous gastrostomy tube opacifies the stomach and proximal small bowel, confirming intraluminal placement.  Bilateral percutaneous nephrostomy catheters.  Nonobstructive bowel gas pattern.  IMPRESSION: Contrast administered via percutaneous  gastrostomy tube opacifies the stomach, confirming intraluminal placement.   Electronically Signed   By: Julian Hy M.D.   On: 11/03/2013 21:55   Assessment/Plan: Sepsis with Bacteremia: Hypotension, refractory, likely secondary to Sepsis:  -continued concern that hypotension may be 2/2 neoplastic process given end stage and TNF action -consider ID Consult, and repeat BC x 2, pCXR, and UA with C and S, and empirically start of  Zosyn 4.5g IV q 6h and Vancomycin (pharm to dose) x 7 days if continued hypotension until ID consult is complete -continue to titrate Levophed to goals and titrate to off as soon able;  -prn lab survey, leukocytes wnl today  Hypokalemia: -mild, replete 30meq KCl x 1 and continue potassium replacement protocol, recheck level in am  Hypomagnesium: -Mag Sulfate 1g IV x 1 with a goal of 2.0, in hopes of preventing acute decompensation, recheck in am and continue mag replacement protocol  Hypernatremia, mild: -if not normalized in am with TF adjustment, start D5W x 1L and reassess. 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: -titrate pain med to goal and tolerance and continue to make attempts to eliminate need for frequent prns -NOTE: pt PTA opiate regimen more aggressive than noted upon transfer so will make attempts to resume home pain medications and adjust as needed -pt should be aligned with palliative care/hospice given end-stage cervical cancer, severe pain needs, and increase morbidity that is resulting as a result of her immunocompromised state, however, she has  adamantly refused both despite extensive conversations at OSH and a note was placed on d/c summary to that patient stated, "DO NOT [place]."  Therefore, we will make attempts to resolve acute sepsis and manage co-morbid disease states but her prognosis remains  grim.  PVD with h/o Pulmonary Embolism: -not sure why pt is not on chronic anticoagulation therapy, esp with neoplasm  Hypothyroidism: -continue Synthroid 68mcg PO daily and recommend recheck in 4 weeks and adjust dose accordingly.  Hyperlipidemia: -continue statin for now and monitor LFTs and CPK prn, also monitor for clinical ADRs associated with statins -no c/o and LFTs wnl to date  Anemia of Chronic Disease, Normocytic Normochromic: -monitor for s/s of acute bleed, none noted this exam -H/H remaining stable, mild decrease in H/H reflective of lab draws  Generalized Weakness: -PT/OT eval and tx.  Pt is amotivated and may benefit from SSRI therapy, re-evaluate need about a week. -Should be UTC for meals at a minimum as she chooses to stay in bed in supine position   PCM, severe: -ST/RD eval and tx. Pt now on continuous TFs + Regular Diet w/TL.  GERD/GI proph: pepcid BID VTE proph: heparin 5000U SQ q8h, question need for therapeutic anticoagulation given PVD and malignancy,however pt was not on coumadin or therapeutic lovenox on admission  Code Status: Full Prognosis: Grim   LOS: 6 days   Howis Y Toler

## 2013-11-09 NOTE — Progress Notes (Addendum)
Subjective: Pt is alert and oriented she states her pain is better controlled than on admission.She denies CP, SOA, N/V OR ABD PAIN.  NOTE: Pt refused her bath today.  Objective:  Tmax:  98.83F HR:  95-103 RR:  12-18 BP:   [phone] Pulse Ox:  98-100% RA I/O:  1092/1950 BM: 2 BG:  95-98   GEN: ill appearing, A&O x 4, in NAD HEENT: PERRLA EOMI bilaterally, Mild dry oral mucosa, +gag reflex, sclerae anicteric bilaterally NECK: Supple, no LAD or JVD LUNGS: Symmetrical chest rise, diminished bibasilar BS otherwise CTAb CV: Normal S1, S2, no m/c/r ABD: S/NT/ND +BS x 4. No rebound, guarding, or peritoneal signs noted. + PEG tube placement and Ileal Conduit RLQ wth adequate output EXT: No CCE, pulses 2+ NEURO: Grossly intact w/o focal deficits, SMAEs, DTRs 2+ PSYCH: Flat affect  Results for orders placed during the hospital encounter of 11/03/13 (from the past 24 hour(s))  BASIC METABOLIC PANEL     Status: Abnormal   Collection Time    11/09/13  7:18 AM      Result Value Ref Range   Sodium 149 (*) 137 - 147 mEq/L   Potassium 4.6  3.7 - 5.3 mEq/L   Chloride 116 (*) 96 - 112 mEq/L   CO2 26  19 - 32 mEq/L   Glucose, Bld 110 (*) 70 - 99 mg/dL   BUN 13  6 - 23 mg/dL   Creatinine, Ser 0.74  0.50 - 1.10 mg/dL   Calcium 8.2 (*) 8.4 - 10.5 mg/dL   GFR calc non Af Amer 86 (*) >90 mL/min   GFR calc Af Amer >90  >90 mL/min   Anion gap 7  5 - 15  MAGNESIUM     Status: None   Collection Time    11/09/13  7:18 AM      Result Value Ref Range   Magnesium 1.9  1.5 - 2.5 mg/dL    Studies/Results: Dg Chest Port 1 View  11/04/2013   CLINICAL DATA:  PICC line placement  EXAM: PORTABLE CHEST - 1 VIEW  COMPARISON:  10/20/2013  FINDINGS: Right arm PICC tip in the SVC at the cavoatrial junction in good position.  Bibasilar atelectasis has increased from the prior study. Small pleural effusions. Negative for heart failure.  IMPRESSION: PICC tip at the cavoatrial junction in good position  Increased  bibasilar atelectasis and small pleural effusions.   Electronically Signed   By: Franchot Gallo M.D.   On: 11/04/2013 08:03   Dg Abd Portable 1v  11/03/2013   CLINICAL DATA:  Check PEG placement  EXAM: PORTABLE ABDOMEN - 1 VIEW  COMPARISON:  CT abdomen pelvis dated 10/27/2013  FINDINGS: Contrast administered via percutaneous gastrostomy tube opacifies the stomach and proximal small bowel, confirming intraluminal placement.  Bilateral percutaneous nephrostomy catheters.  Nonobstructive bowel gas pattern.  IMPRESSION: Contrast administered via percutaneous gastrostomy tube opacifies the stomach, confirming intraluminal placement.   Electronically Signed   By: Julian Hy M.D.   On: 11/03/2013 21:55   Assessment/Plan:  NOTE: Continue active mgmt of treatment plan below.  Sepsis with Bacteremia: Hypotension, refractory, likely secondary to Sepsis:  -continue Levophed and titrate to goals -pt afebrile and non-toxic -lab survey (BMP, CBC, mag, phos in am) -if continued refractory hypotension, repeat ID w/u, likely her baseline SBP is 80-90's given that she is asymptomatic with lower blood pressure readings  End Stage Cervical Cancer/Chronic Pain Syndrome secondary to neoplastic process: -titrate pain med to goal and tolerance -plan  to d/w pt, family and care team palliative care and its offerings  PVD with h/o Pulmonary Embolism: -not sure why pt is not on chronic anticoagulation therapy, esp with neoplasm  Anemia of Chronic Disease, Normocytic Normochromic: -monitor for s/s of acute bleed and acute hemodynamic instability  Generalized Weakness: -PT/OT eval and tx.  Pt is amotivated and may benefit from SSRI therapy, re-evaluate need about a week.  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 RD adjusting diet frequently given anorexia and pts refusal to eat. -May benefit from appetite stimulant, reassess after resolution of sepsis.  GERD/GI proph: pepcid BID VTE proph: heparin 5000U SQ q8h,  question need for therapeutic anticoagulation given PVD and malignancy,however pt was not on coumadin or therapeutic lovenox on admission   LOS: 6 days   Howis Y Toler

## 2013-11-09 NOTE — Progress Notes (Signed)
Subjective: Pt is alert and oriented she states her pain is better controlled than on admission.She denies CP, SOA, N/V OR ABD PAIN.    Objective:  Tmax:  98.96F HR:  92-101 RR:  12-15 BP:   81/52-107/62 Pulse Ox:  95-100% RA I/O:  5595/3550 BM: 2 BG:  95-120   GEN: ill appearing, A&O x 4, in NAD HEENT: PERRLA EOMI bilaterally, Mild dry oral mucosa, +gag reflex, sclerae anicteric bilaterally NECK: Supple, no LAD or JVD LUNGS: Symmetrical chest rise, diminished bibasilar BS otherwise CTAb CV: Normal S1, S2, no m/c/r ABD: S/NT/ND +BS x 4. No rebound, guarding, or peritoneal signs noted. + PEG tube placement and Ileal Conduit RLQ wth adequate output EXT: No CCE, pulses 2+ NEURO: Grossly intact w/o focal deficits, SMAEs, DTRs 2+ PSYCH: Flat affect  No results found for this or any previous visit (from the past 24 hour(s)).  Studies/Results: Dg Chest Port 1 View  11/04/2013   CLINICAL DATA:  PICC line placement  EXAM: PORTABLE CHEST - 1 VIEW  COMPARISON:  10/20/2013  FINDINGS: Right arm PICC tip in the SVC at the cavoatrial junction in good position.  Bibasilar atelectasis has increased from the prior study. Small pleural effusions. Negative for heart failure.  IMPRESSION: PICC tip at the cavoatrial junction in good position  Increased bibasilar atelectasis and small pleural effusions.   Electronically Signed   By: Franchot Gallo M.D.   On: 11/04/2013 08:03   Dg Abd Portable 1v  11/03/2013   CLINICAL DATA:  Check PEG placement  EXAM: PORTABLE ABDOMEN - 1 VIEW  COMPARISON:  CT abdomen pelvis dated 10/27/2013  FINDINGS: Contrast administered via percutaneous gastrostomy tube opacifies the stomach and proximal small bowel, confirming intraluminal placement.  Bilateral percutaneous nephrostomy catheters.  Nonobstructive bowel gas pattern.  IMPRESSION: Contrast administered via percutaneous gastrostomy tube opacifies the stomach, confirming intraluminal placement.   Electronically Signed   By:  Julian Hy M.D.   On: 11/03/2013 21:55   Assessment/Plan: Sepsis with Bacteremia: Hypotension, refractory, likely secondary to Sepsis:  -completing IVF NS today -continue Levophed and titrate to goals -pt afebrile and non-toxic -prn lab survey -if continued refractory hypotension, repeat ID w/u, likely her baseline SBP is 80-90's given that she is asymptomatic with lower blood pressure readings  End Stage Cervical Cancer/Chronic Pain Syndrome secondary to neoplastic process: -titrate pain med to goal and tolerance -plan to d/w pt, family and care team palliative care and its offerings  PVD with h/o Pulmonary Embolism: -not sure why pt is not on chronic anticoagulation therapy, esp with neoplasm  Anemia of Chronic Disease, Normocytic Normochromic: -monitor for s/s of acute bleed and acute hemodynamic instability  Generalized Weakness: -PT/OT eval and tx.  Pt is amotivated and may benefit from SSRI therapy, re-evaluate need about a week.  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 RD adjusting diet frequently given anorexia and pts refusal to eat. -May benefit from appetite stimulant, reassess after resolution of sepsis.  GERD/GI proph: pepcid BID VTE proph: heparin 5000U SQ q8h, question need for therapeutic anticoagulation given PVD and malignancy,however pt was not on coumadin or therapeutic lovenox on admission   LOS: 6 days   Howis Y Toler

## 2013-11-09 NOTE — Progress Notes (Addendum)
Subjective: Pt is alert and oriented pain now controlled well with current medication, otherwise she denies CP, SOA, N/V OR ABD PAIN.    Objective:  Tmax:  99.79F HR:  81-113 RR:  18-20 BP:  80/37-107/56 on Levophed Pulse Ox:  97-99% RA I/O:  2550/2200 BM: 2 BG:  108-119 LINES: RUA PICC FOLEY: none, Ileal Conduit   GEN: ill appearing, A&O x 4, in NAD HEENT: PERRLA EOMI bilaterally, Mild dry oral mucosa, +gag reflex, sclerae anicteric bilaterally NECK: Supple, no LAD or JVD LUNGS: Symmetrical chest rise, diminished bibasilar BS otherwise CTAb CV: Normal S1, S2, no m/c/r ABD: S/NT/ND +BS x 4. No rebound, guarding, or peritoneal signs noted. + PEG tube placement and Ileal Conduit RLQ wth adequate output EXT: No CCE, pulses 2+ NEURO: Grossly intact w/o focal deficits, SMAEs, DTRs 2+ PSYCH: Flat affect  No results found for this or any previous visit (from the past 24 hour(s)).  Studies/Results: Dg Chest Port 1 View  11/04/2013   CLINICAL DATA:  PICC line placement  EXAM: PORTABLE CHEST - 1 VIEW  COMPARISON:  10/20/2013  FINDINGS: Right arm PICC tip in the SVC at the cavoatrial junction in good position.  Bibasilar atelectasis has increased from the prior study. Small pleural effusions. Negative for heart failure.  IMPRESSION: PICC tip at the cavoatrial junction in good position  Increased bibasilar atelectasis and small pleural effusions.   Electronically Signed   By: Franchot Gallo M.D.   On: 11/04/2013 08:03   Dg Abd Portable 1v  11/03/2013   CLINICAL DATA:  Check PEG placement  EXAM: PORTABLE ABDOMEN - 1 VIEW  COMPARISON:  CT abdomen pelvis dated 10/27/2013  FINDINGS: Contrast administered via percutaneous gastrostomy tube opacifies the stomach and proximal small bowel, confirming intraluminal placement.  Bilateral percutaneous nephrostomy catheters.  Nonobstructive bowel gas pattern.  IMPRESSION: Contrast administered via percutaneous gastrostomy tube opacifies the stomach, confirming  intraluminal placement.   Electronically Signed   By: Julian Hy M.D.   On: 11/03/2013 21:55   Assessment/Plan: Sepsis with Bacteremia: Hypotension, refractory, likely secondary to Sepsis:  -continued concern that hypotension may be 2/2 neoplastic process given end stage and TNF action -ID Consult, repeat BC x 2, pCXR, and UA with C and S, and empirically start Zosyn 4.5g IV q 6h and Vancomycin (pharm to dose) x 7 days; if needed consider above and pt will need to have the discussion of palliative care nevertheless and discuss maintenance and discharge tx plan. -continue to titrate Levophed to goals and titrate to off as soon able; pt has not be able to remain off of Levophed for 24h despite adjusting parameters to start only if SBP>80.  -prn lab survey  Hypokalemia: -mild, replete 20meq KCl x 1 and continue potassium replacement protocol, recheck level in am  Hypomagnesium: -Mag Sulfate 1g IV x 1 with a goal of 2.0, in hopes of preventing acute decompensation, recheck in am and continue mag replacement protocol  Hypernatremia, mild: -if not normalized in am with TF adjustment, start D5W x 1L and reassess. Should be able to remain normalized with FWF and TF, repeat lab survey in am End Stage Cervical Cancer/Chronic Pain Syndrome secondary to neoplastic process: -titrate pain med to goal and tolerance and continue to make attempts to eliminate need for frequent prns -NOTE: pt PTA opiate regimen more aggressive than noted upon transfer so will make attempts to resume home pain medications and adjust as needed -pt should be aligned with palliative care/hospice given end-stage cervical  cancer, severe pain needs, and increase morbidity that is resulting as a result of her immunocompromised state, however, she has adamantly refused both despite extensive conversations at OSH and a note was placed on d/c summary to that patient stated, "DO NOT MENTION HOSPICE TO Lori Chaney."  Therefore, we  will make attempts to resolve acute sepsis and manage co-morbid disease states but her prognosis remains grim.  PVD with h/o Pulmonary Embolism: -not sure why pt is not on chronic anticoagulation therapy, esp with neoplasm  Hypothyroidism: -TSH on admission elevated at 8.640, started on Synthroid 54mcg PO daily and recommend recheck in 4 weeks and adjust dose accordingly.  Hyperlipidemia: -continue statin for now and monitor LFTs and CPK prn, also monitor for clinical ADRs associated with statins -no c/o and LFTs wnl to date  Anemia of Chronic Disease, Normocytic Normochromic: -monitor for s/s of acute bleed, none noted this exam  Generalized Weakness: -PT/OT eval and tx.  Pt is amotivated and may benefit from SSRI therapy, re-evaluate need about a week. -Should be UTC for meals at a minimum as she chooses to stay in bed in supine position  -d/w sister Lori Chaney) and she states that patient curses them when they try to make her eat or get her out of bed, explained to sister (whom is a CNA) that pt has high risk for skin breakdown and pneumonia. Sister states that she and her other sister (MPOA and LPN) reinforced this concept to patient each time they see her to no avail.  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 Pt now on continuous TFs being that she refuses to eat even when directed and assisted. May benefit from appetite stimulant (pt has been on Megace in the past per sister), reassess after resolution of sepsis.  GERD/GI proph: pepcid BID VTE proph: heparin 5000U SQ q8h, question need for therapeutic anticoagulation given PVD and malignancy,however pt was not on coumadin or therapeutic lovenox on admission  Code Status: Full Prognosis: Grim   LOS: 6 days   Howis Y Toler

## 2013-11-10 NOTE — Progress Notes (Signed)
Subjective: Pt is alert and oriented and has no new complaints    Objective:  Tmax: 98.7 HR:  70 RR:  17 BP:  103/64 Pulse Ox:  100% I/O:  -910 Ileal conduit noted   GEN: ill appearing, A&O x 4, in NAD HEENT: PERRLA EOMI bilaterally, Mild dry oral mucosa, +gag reflex, sclerae anicteric bilaterally NECK: Supple, no LAD or JVD LUNGS: Symmetrical chest rise, diminished bibasilar BS otherwise CTAb CV: Normal S1, S2, no m/c/r ABD: S/NT/ND +BS x 4. No rebound, guarding, or peritoneal signs noted. + PEG tube placement and Ileal Conduit RLQ wth adequate output Nephrostomy sites red and look infected EXT: No CCE, pulses 2+ NEURO: Grossly intact w/o focal deficits, SMAEs, DTRs 2+ PSYCH: Flat affect  Results for orders placed during the hospital encounter of 11/03/13 (from the past 24 hour(s))  URINALYSIS, ROUTINE W REFLEX MICROSCOPIC     Status: Abnormal   Collection Time    11/09/13  4:08 PM      Result Value Ref Range   Color, Urine YELLOW  YELLOW   APPearance CLOUDY (*) CLEAR   Specific Gravity, Urine 1.008  1.005 - 1.030   pH 6.0  5.0 - 8.0   Glucose, UA NEGATIVE  NEGATIVE mg/dL   Hgb urine dipstick MODERATE (*) NEGATIVE   Bilirubin Urine NEGATIVE  NEGATIVE   Ketones, ur NEGATIVE  NEGATIVE mg/dL   Protein, ur NEGATIVE  NEGATIVE mg/dL   Urobilinogen, UA 0.2  0.0 - 1.0 mg/dL   Nitrite NEGATIVE  NEGATIVE   Leukocytes, UA LARGE (*) NEGATIVE  URINE MICROSCOPIC-ADD ON     Status: Abnormal   Collection Time    11/09/13  4:08 PM      Result Value Ref Range   WBC, UA 21-50  <3 WBC/hpf   RBC / HPF 7-10  <3 RBC/hpf   Bacteria, UA MANY (*) RARE   Urine-Other MANY YEAST    URINALYSIS, ROUTINE W REFLEX MICROSCOPIC     Status: Abnormal   Collection Time    11/09/13  4:10 PM      Result Value Ref Range   Color, Urine YELLOW  YELLOW   APPearance CLOUDY (*) CLEAR   Specific Gravity, Urine 1.007  1.005 - 1.030   pH 6.5  5.0 - 8.0   Glucose, UA NEGATIVE  NEGATIVE mg/dL   Hgb urine  dipstick MODERATE (*) NEGATIVE   Bilirubin Urine NEGATIVE  NEGATIVE   Ketones, ur NEGATIVE  NEGATIVE mg/dL   Protein, ur NEGATIVE  NEGATIVE mg/dL   Urobilinogen, UA 0.2  0.0 - 1.0 mg/dL   Nitrite POSITIVE (*) NEGATIVE   Leukocytes, UA LARGE (*) NEGATIVE  URINE MICROSCOPIC-ADD ON     Status: Abnormal   Collection Time    11/09/13  4:10 PM      Result Value Ref Range   WBC, UA 21-50  <3 WBC/hpf   RBC / HPF 7-10  <3 RBC/hpf   Bacteria, UA MANY (*) RARE  URINE CULTURE     Status: None   Collection Time    11/09/13  4:12 PM      Result Value Ref Range   Specimen Description URINE, CATHETERIZED     Special Requests RIGHT NEPHROSTOMY TUBE     Colony Count PENDING     Culture PENDING     Report Status PENDING    URINE CULTURE     Status: None   Collection Time    11/09/13  4:12 PM  Result Value Ref Range   Specimen Description URINE, CATHETERIZED     Special Requests LEFT NEPHROSTOMY TUBE     Colony Count PENDING     Culture PENDING     Report Status PENDING      Studies/Results: Dg Chest Port 1 View  11/04/2013   CLINICAL DATA:  PICC line placement  EXAM: PORTABLE CHEST - 1 VIEW  COMPARISON:  10/20/2013  FINDINGS: Right arm PICC tip in the SVC at the cavoatrial junction in good position.  Bibasilar atelectasis has increased from the prior study. Small pleural effusions. Negative for heart failure.  IMPRESSION: PICC tip at the cavoatrial junction in good position  Increased bibasilar atelectasis and small pleural effusions.   Electronically Signed   By: Franchot Gallo M.D.   On: 11/04/2013 08:03   Dg Abd Portable 1v  11/03/2013   CLINICAL DATA:  Check PEG placement  EXAM: PORTABLE ABDOMEN - 1 VIEW  COMPARISON:  CT abdomen pelvis dated 10/27/2013  FINDINGS: Contrast administered via percutaneous gastrostomy tube opacifies the stomach and proximal small bowel, confirming intraluminal placement.  Bilateral percutaneous nephrostomy catheters.  Nonobstructive bowel gas pattern.   IMPRESSION: Contrast administered via percutaneous gastrostomy tube opacifies the stomach, confirming intraluminal placement.   Electronically Signed   By: Julian Hy M.D.   On: 11/03/2013 21:55   Assessment/Plan: Sepsis with Bacteremia: Hypotension, refractory, likely secondary to Sepsis:  Nephrostomy site looks infected Blood cultures pending Hypokalemia: -Resolved Hypomagnesium: Repleted Hypernatremia, mild: Improving monitor End Stage Cervical Cancer/Chronic Pain Syndrome secondary to neoplastic process: Negative prognosis. Because of care discussed with patient PVD with h/o Pulmonary Embolism: -not sure why pt is not on chronic anticoagulation therapy, esp with neoplasm Hypothyroidism: Continue treatment. Hyperlipidemia: -continue statin for now and monitor LFTs and CPK prn, also monitor for clinical ADRs associated with statins -no c/o and LFTs wnl to date Anemia of Chronic Disease, Normocytic Normochromic: -monitor for s/s of acute bleed, none noted this exam Generalized Weakness: -PT/OT eval and tx.  Pt is amotivated and may benefit from SSRI therapy, re-evaluate need about a week. -Should be UTC for meals at a minimum as she chooses to stay in bed in supine position  -d/w sister Suanne Marker) and she states that patient curses them when they try to make her eat or get her out of bed, explained to sister (whom is a CNA) that pt has high risk for skin breakdown and pneumonia. Sister states that she and her other sister (MPOA and LPN) reinforced this concept to patient each time they see her to no avail. 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 Pt now on continuous TFs being that she refuses to eat even when directed and assisted. May benefit from appetite stimulant (pt has been on Megace in the past per sister), reassess after resolution of sepsis. GERD/GI proph: pepcid BID VTE proph: heparin 5000U SQ q8h, question need for therapeutic anticoagulation given PVD and malignancy,however  pt was not on coumadin or therapeutic lovenox on admission  Code Status: Full Prognosis: Grim   LOS: 7 days   Merton Border

## 2013-11-11 NOTE — Progress Notes (Signed)
Subjective: Pt is alert and oriented and has no new complaints    Objective:  Tmax: 97.6 HR:  71 RR:  20 BP:  106/59 Pulse Ox:  97% I/O: -1630 Ileal conduit noted   GEN: ill appearing, A&O x 4, in NAD HEENT: PERRLA EOMI bilaterally, Mild dry oral mucosa, +gag reflex, sclerae anicteric bilaterally NECK: Supple, no LAD or JVD LUNGS: Symmetrical chest rise, diminished bibasilar BS otherwise CTAb CV: Normal S1, S2, no m/c/r ABD: S/NT/ND +BS x 4. No rebound, guarding, or peritoneal signs noted. + PEG tube placement and Ileal Conduit RLQ wth adequate output Nephrostomy sites red and look infected EXT: No CCE, pulses 2+ NEURO: Grossly intact w/o focal deficits, SMAEs, DTRs 2+ PSYCH: Flat affect  No results found for this or any previous visit (from the past 24 hour(s)).  Studies/Results: Dg Chest Port 1 View  11/04/2013   CLINICAL DATA:  PICC line placement  EXAM: PORTABLE CHEST - 1 VIEW  COMPARISON:  10/20/2013  FINDINGS: Right arm PICC tip in the SVC at the cavoatrial junction in good position.  Bibasilar atelectasis has increased from the prior study. Small pleural effusions. Negative for heart failure.  IMPRESSION: PICC tip at the cavoatrial junction in good position  Increased bibasilar atelectasis and small pleural effusions.   Electronically Signed   By: Franchot Gallo M.D.   On: 11/04/2013 08:03   Dg Abd Portable 1v  11/03/2013   CLINICAL DATA:  Check PEG placement  EXAM: PORTABLE ABDOMEN - 1 VIEW  COMPARISON:  CT abdomen pelvis dated 10/27/2013  FINDINGS: Contrast administered via percutaneous gastrostomy tube opacifies the stomach and proximal small bowel, confirming intraluminal placement.  Bilateral percutaneous nephrostomy catheters.  Nonobstructive bowel gas pattern.  IMPRESSION: Contrast administered via percutaneous gastrostomy tube opacifies the stomach, confirming intraluminal placement.   Electronically Signed   By: Julian Hy M.D.   On: 11/03/2013 21:55    Assessment/Plan: Sepsis with Bacteremia: Hypotension, refractory, likely secondary to Sepsis: Improving Nephrostomy site looks infected start tapering Levophed Blood cultures pending Hypokalemia: -Resolved Hypomagnesium: Repleted Hypernatremia, mild: Improving monitor End Stage Cervical Cancer/Chronic Pain Syndrome secondary to neoplastic process: Negative prognosis. Because of care discussed with patient PVD with h/o Pulmonary Embolism: -not sure why pt is not on chronic anticoagulation therapy, esp with neoplasm Hypothyroidism: Continue treatment. Hyperlipidemia: -continue statin for now and monitor LFTs and CPK prn, also monitor for clinical ADRs associated with statins -no c/o and LFTs wnl to date Anemia of Chronic Disease, Normocytic Normochromic: -monitor for s/s of acute bleed, none noted this exam Generalized Weakness: -PT/OT eval and tx.  Pt is amotivated and may benefit from SSRI therapy, re-evaluate need about a week. -Should be UTC for meals at a minimum as she chooses to stay in bed in supine position  -d/w sister Suanne Marker) and she states that patient curses them when they try to make her eat or get her out of bed, explained to sister (whom is a CNA) that pt has high risk for skin breakdown and pneumonia. Sister states that she and her other sister (MPOA and LPN) reinforced this concept to patient each time they see her to no avail. 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 Pt now on continuous TFs being that she refuses to eat even when directed and assisted. May benefit from appetite stimulant (pt has been on Megace in the past per sister), reassess after resolution of sepsis. GERD/GI proph: pepcid BID VTE proph: heparin 5000U SQ q8h, question need for therapeutic anticoagulation given  PVD and malignancy,however pt was not on coumadin or therapeutic lovenox on admission  Code Status: Full Prognosis: Grim   LOS: 8 days   Merton Border

## 2013-11-12 LAB — COMPREHENSIVE METABOLIC PANEL
ALT: 8 U/L (ref 0–35)
ANION GAP: 11 (ref 5–15)
AST: 19 U/L (ref 0–37)
Albumin: 1.1 g/dL — ABNORMAL LOW (ref 3.5–5.2)
Alkaline Phosphatase: 115 U/L (ref 39–117)
BILIRUBIN TOTAL: 0.3 mg/dL (ref 0.3–1.2)
BUN: 12 mg/dL (ref 6–23)
CHLORIDE: 107 meq/L (ref 96–112)
CO2: 27 mEq/L (ref 19–32)
Calcium: 8.4 mg/dL (ref 8.4–10.5)
Creatinine, Ser: 0.74 mg/dL (ref 0.50–1.10)
GFR calc non Af Amer: 86 mL/min — ABNORMAL LOW (ref >90)
GLUCOSE: 124 mg/dL — AB (ref 70–99)
Potassium: 4.6 mEq/L (ref 3.7–5.3)
Sodium: 145 mEq/L (ref 137–147)
TOTAL PROTEIN: 6.1 g/dL (ref 6.0–8.3)

## 2013-11-12 LAB — CBC
HCT: 27.4 % — ABNORMAL LOW (ref 36.0–46.0)
Hemoglobin: 8.6 g/dL — ABNORMAL LOW (ref 12.0–15.0)
MCH: 28.7 pg (ref 26.0–34.0)
MCHC: 31.4 g/dL (ref 30.0–36.0)
MCV: 91.3 fL (ref 78.0–100.0)
Platelets: 378 10*3/uL (ref 150–400)
RBC: 3 MIL/uL — ABNORMAL LOW (ref 3.87–5.11)
RDW: 17.1 % — AB (ref 11.5–15.5)
WBC: 9.4 10*3/uL (ref 4.0–10.5)

## 2013-11-12 LAB — VANCOMYCIN, TROUGH: VANCOMYCIN TR: 29.1 ug/mL — AB (ref 10.0–20.0)

## 2013-11-13 NOTE — Progress Notes (Signed)
Subjective: Pt is alert and oriented and has no new complaints    Objective:  Tmax: 98.8 HR:  103 RR:  16 BP:  90/50 Pulse Ox: 98% I/O: - 357 Ileal conduit noted   GEN: ill appearing, A&O x 4, in NAD HEENT: PERRLA EOMI bilaterally, Mild dry oral mucosa, +gag reflex, sclerae anicteric bilaterally NECK: Supple, no LAD or JVD LUNGS: Symmetrical chest rise, diminished bibasilar BS otherwise CTAb CV: Normal S1, S2, no m/c/r ABD: S/NT/ND +BS x 4. No rebound, guarding, or peritoneal signs noted. + PEG tube placement and Ileal Conduit RLQ wth adequate output Nephrostomy sites red and look infected EXT: No CCE, pulses 2+ NEURO: Grossly intact w/o focal deficits, SMAEs, DTRs 2+ PSYCH: Flat affect  No results found for this or any previous visit (from the past 24 hour(s)).  Studies/Results: Dg Chest Port 1 View  11/04/2013   CLINICAL DATA:  PICC line placement  EXAM: PORTABLE CHEST - 1 VIEW  COMPARISON:  10/20/2013  FINDINGS: Right arm PICC tip in the SVC at the cavoatrial junction in good position.  Bibasilar atelectasis has increased from the prior study. Small pleural effusions. Negative for heart failure.  IMPRESSION: PICC tip at the cavoatrial junction in good position  Increased bibasilar atelectasis and small pleural effusions.   Electronically Signed   By: Franchot Gallo M.D.   On: 11/04/2013 08:03   Dg Abd Portable 1v  11/03/2013   CLINICAL DATA:  Check PEG placement  EXAM: PORTABLE ABDOMEN - 1 VIEW  COMPARISON:  CT abdomen pelvis dated 10/27/2013  FINDINGS: Contrast administered via percutaneous gastrostomy tube opacifies the stomach and proximal small bowel, confirming intraluminal placement.  Bilateral percutaneous nephrostomy catheters.  Nonobstructive bowel gas pattern.  IMPRESSION: Contrast administered via percutaneous gastrostomy tube opacifies the stomach, confirming intraluminal placement.   Electronically Signed   By: Julian Hy M.D.   On: 11/03/2013 21:55    Assessment/Plan: SIRS continue with IV antibiotics Hypotension, refractory, likely secondary to Sepsis: Improving Nephrostomy site looks infected start tapering Levophed Blood cultures pending Hypokalemia: -Resolved Hypomagnesium: Repleted Hypernatremia, mild: Improving monitor End Stage Cervical Cancer/Chronic Pain Syndrome secondary to neoplastic process: Negative prognosis. Because of care discussed with patient PVD with h/o Pulmonary Embolism: -not sure why pt is not on chronic anticoagulation therapy, esp with neoplasm Hypothyroidism: Continue treatment. Hyperlipidemia: -continue statin for now and monitor LFTs and CPK prn, also monitor for clinical ADRs associated with statins -no c/o and LFTs wnl to date Anemia of Chronic Disease, Normocytic Normochromic: -monitor for s/s of acute bleed, none noted this exam Generalized Weakness: -PT/OT eval and tx.  Pt is amotivated and may benefit from SSRI therapy, re-evaluate need about a week. -Should be UTC for meals at a minimum as she chooses to stay in bed in supine position  -d/w sister Suanne Marker) and she states that patient curses them when they try to make her eat or get her out of bed, explained to sister (whom is a CNA) that pt has high risk for skin breakdown and pneumonia. Sister states that she and her other sister (MPOA and LPN) reinforced this concept to patient each time they see her to no avail. 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 Pt now on continuous TFs being that she refuses to eat even when directed and assisted. May benefit from appetite stimulant (pt has been on Megace in the past per sister), reassess after resolution of sepsis. GERD/GI proph: pepcid BID VTE proph: heparin 5000U SQ q8h, question need for therapeutic  anticoagulation given PVD and malignancy,however pt was not on coumadin or therapeutic lovenox on admission  Code Status: Full Prognosis: Grim   LOS: 10 days   Merton Border

## 2013-11-14 LAB — URINE CULTURE: Colony Count: >=100000

## 2013-11-14 NOTE — Progress Notes (Signed)
Received notification via [place], Infection Prevention call phone from [facility] reporting recent urine cultures x2 resulted in [place]. Notified [facility] nurse Evette Doffing, RN of this and that cultures grew multi-drug resistant Klebsiella and Stenotrophomonas. Nurse Loletta Specter reported patient is currently on contact precautions and agreed to notify MD.

## 2013-11-14 NOTE — Progress Notes (Signed)
Subjective: Pt is alert and oriented and has no new complaints    Objective:  T: 100 HR:  99 RR:  18 BP:  92/40 Pulse Ox: 97% I/O: - 1460 Ileal conduit noted   GEN: ill appearing, A&O x 4, in NAD HEENT: PERRLA EOMI bilaterally, Mild dry oral mucosa, +gag reflex, sclerae anicteric bilaterally NECK: Supple, no LAD or JVD LUNGS: Symmetrical chest rise, diminished bibasilar BS otherwise CTAb CV: Normal S1, S2, no m/c/r ABD: S/NT/ND +BS x 4. No rebound, guarding, or peritoneal signs noted. + PEG tube placement and Ileal Conduit RLQ wth adequate output Nephrostomy sites red and look infected EXT: No CCE, pulses 2+ NEURO: Grossly intact w/o focal deficits, SMAEs, DTRs 2+ PSYCH: Flat affect  No results found for this or any previous visit (from the past 24 hour(s)).  Studies/Results: Dg Chest Port 1 View  11/04/2013   CLINICAL DATA:  PICC line placement  EXAM: PORTABLE CHEST - 1 VIEW  COMPARISON:  10/20/2013  FINDINGS: Right arm PICC tip in the SVC at the cavoatrial junction in good position.  Bibasilar atelectasis has increased from the prior study. Small pleural effusions. Negative for heart failure.  IMPRESSION: PICC tip at the cavoatrial junction in good position  Increased bibasilar atelectasis and small pleural effusions.   Electronically Signed   By: Franchot Gallo M.D.   On: 11/04/2013 08:03   Dg Abd Portable 1v  11/03/2013   CLINICAL DATA:  Check PEG placement  EXAM: PORTABLE ABDOMEN - 1 VIEW  COMPARISON:  CT abdomen pelvis dated 10/27/2013  FINDINGS: Contrast administered via percutaneous gastrostomy tube opacifies the stomach and proximal small bowel, confirming intraluminal placement.  Bilateral percutaneous nephrostomy catheters.  Nonobstructive bowel gas pattern.  IMPRESSION: Contrast administered via percutaneous gastrostomy tube opacifies the stomach, confirming intraluminal placement.   Electronically Signed   By: Julian Hy M.D.   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 Because of care discussed with patient PVD with h/o Pulmonary Embolism: -not sure why pt is not on chronic anticoagulation therapy, esp with neoplasm Hypothyroidism: Continue treatment. Hyperlipidemia: -continue statin for now and monitor LFTs and CPK prn, also monitor for clinical ADRs associated with statins -no c/o and LFTs wnl to date Anemia of Chronic Disease, Normocytic Normochromic: -monitor for s/s of acute bleed, none noted this exam Generalized Weakness: -PT/OT eval and tx.  Pt is amotivated and may benefit from SSRI therapy, re-evaluate need about a week. -Should be UTC for meals at a minimum as she chooses to stay in bed in supine position  -d/w sister Suanne Marker) and she states that patient curses them when they try to make her eat or get her out of bed, explained to sister (whom is a CNA) that pt has high risk for skin breakdown and pneumonia. Sister states that she and her other sister (MPOA and LPN) reinforced this concept to patient each time they see her to no avail. 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 Pt now on continuous TFs being that she refuses to eat even when directed and assisted. May benefit from appetite stimulant (pt has been on Megace in the past per sister), reassess after resolution of sepsis. GERD/GI proph: pepcid BID  VTE proph: heparin 5000U SQ q8h, question need for therapeutic anticoagulation given PVD and malignancy,however pt was not on coumadin or therapeutic lovenox on admission  Code  Status: Full Prognosis: Grim   LOS: 11 days   Merton Border

## 2013-11-15 ENCOUNTER — Other Ambulatory Visit (HOSPITAL_COMMUNITY): Payer: Medicare Other

## 2013-11-15 LAB — CBC
HCT: 24.1 % — ABNORMAL LOW (ref 36.0–46.0)
Hemoglobin: 7.6 g/dL — ABNORMAL LOW (ref 12.0–15.0)
MCH: 28 pg (ref 26.0–34.0)
MCHC: 31.5 g/dL (ref 30.0–36.0)
MCV: 88.9 fL (ref 78.0–100.0)
PLATELETS: 578 10*3/uL — AB (ref 150–400)
RBC: 2.71 MIL/uL — AB (ref 3.87–5.11)
RDW: 18.2 % — AB (ref 11.5–15.5)
WBC: 11.6 10*3/uL — AB (ref 4.0–10.5)

## 2013-11-15 LAB — BASIC METABOLIC PANEL
Anion gap: 11 (ref 5–15)
BUN: 14 mg/dL (ref 6–23)
CO2: 25 meq/L (ref 19–32)
Calcium: 8.3 mg/dL — ABNORMAL LOW (ref 8.4–10.5)
Chloride: 103 mEq/L (ref 96–112)
Creatinine, Ser: 0.84 mg/dL (ref 0.50–1.10)
GFR calc non Af Amer: 70 mL/min — ABNORMAL LOW (ref >90)
GFR, EST AFRICAN AMERICAN: 82 mL/min — AB (ref >90)
Glucose, Bld: 126 mg/dL — ABNORMAL HIGH (ref 70–99)
POTASSIUM: 4.6 meq/L (ref 3.7–5.3)
SODIUM: 139 meq/L (ref 137–147)

## 2013-11-15 LAB — CULTURE, BLOOD (ROUTINE X 2)
CULTURE: NO GROWTH
Culture: NO GROWTH

## 2013-11-16 LAB — BASIC METABOLIC PANEL
Anion gap: 11 (ref 5–15)
BUN: 14 mg/dL (ref 6–23)
CHLORIDE: 103 meq/L (ref 96–112)
CO2: 25 meq/L (ref 19–32)
CREATININE: 0.8 mg/dL (ref 0.50–1.10)
Calcium: 8.8 mg/dL (ref 8.4–10.5)
GFR calc Af Amer: 86 mL/min — ABNORMAL LOW (ref >90)
GFR calc non Af Amer: 75 mL/min — ABNORMAL LOW (ref >90)
Glucose, Bld: 88 mg/dL (ref 70–99)
POTASSIUM: 5 meq/L (ref 3.7–5.3)
SODIUM: 139 meq/L (ref 137–147)

## 2013-11-16 NOTE — Progress Notes (Signed)
Subjective: Pt is alert and oriented pain now controlled well with current medication, otherwise she denies CP, SOA, N/V OR ABD PAIN.    Objective:  Tmax:   98.25F HR:    90-96 RR:    14-18 BP:    94/40-112/52 on Levophed Pulse Ox:   98% RA I/O:    3388/4000 BM:   2 BG:    109-128 LINES:  RUA PICC FOLEY:  none, Ileal Conduit   GEN: ill appearing, A&O x 4, in NAD HEENT: PERRLA EOMI bilaterally, MMM, +gag reflex, sclerae anicteric bilaterally NECK: Supple, no LAD or JVD LUNGS: Symmetrical chest rise, diminished bibasilar BS otherwise CTAb CV: Normal S1, S2, no m/c/r ABD: S/NT/ND +BS x 4. No rebound, guarding, or peritoneal signs noted. + PEG tube placement and Ileal Conduit RLQ wth adequate output EXT: No CCE, pulses 2+ NEURO: Grossly intact w/o focal deficits, SMAEs, DTRs 2+ PSYCH: Flat affect  Results for orders placed during the hospital encounter of 11/03/13 (from the past 24 hour(s))  BASIC METABOLIC PANEL     Status: Abnormal   Collection Time    11/16/13  6:35 AM      Result Value Ref Range   Sodium 139  137 - 147 mEq/L   Potassium 5.0  3.7 - 5.3 mEq/L   Chloride 103  96 - 112 mEq/L   CO2 25  19 - 32 mEq/L   Glucose, Bld 88  70 - 99 mg/dL   BUN 14  6 - 23 mg/dL   Creatinine, Ser 0.80  0.50 - 1.10 mg/dL   Calcium 8.8  8.4 - 10.5 mg/dL   GFR calc non Af Amer 75 (*) >90 mL/min   GFR calc Af Amer 86 (*) >90 mL/min   Anion gap 11  5 - 15    Studies/Results: Dg Chest Port 1 View  11/04/2013   CLINICAL DATA:  PICC line placement  EXAM: PORTABLE CHEST - 1 VIEW  COMPARISON:  10/20/2013  FINDINGS: Right arm PICC tip in the SVC at the cavoatrial junction in good position.  Bibasilar atelectasis has increased from the prior study. Small pleural effusions. Negative for heart failure.  IMPRESSION: PICC tip at the cavoatrial junction in good position  Increased bibasilar atelectasis and small pleural effusions.   Electronically Signed   By: Franchot Gallo M.D.   On: 11/04/2013  08:03   Dg Abd Portable   11/03/2013   CLINICAL DATA:  Check PEG placement  EXAM: PORTABLE ABDOMEN - 1 VIEW  COMPARISON:  CT abdomen pelvis dated 10/27/2013  FINDINGS: Contrast administered via percutaneous gastrostomy tube opacifies the stomach and proximal small bowel, confirming intraluminal placement.  Bilateral percutaneous nephrostomy catheters.  Nonobstructive bowel gas pattern.  IMPRESSION: Contrast administered via percutaneous gastrostomy tube opacifies the stomach, confirming intraluminal placement.   Electronically Signed   By: Julian Hy M.D.   On: 11/03/2013 21:55   Assessment/Plan: Sepsis with Severely Resistant UTI with co-infection in bilateral nephrostomy tubes: Hypotension, refractory, likely secondary to Sepsis:  -attempting to wean off Levophed when able -repeat BC x 2 negative to date -ID Consult reinforced grim prognosis and the need to exchange nephrostomy tubes given severely resistant infection if resolution of infection will be possible... Klebsiella Pneumoniae only sensitive to Amikacin/Tetracycline; Stenotrophomonas Maltophilia only sensitive to Levofloxacin/Bactrim.  -Consult IR for nephrostomy tube replacement; will need to have family meeting regarding prognosis and ADRs of abx (i.e. 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 process:  PVD with h/o Pulmonary Embolism: -grave prognosis and pt continues to be resistant to palliative care conversations -titrate pain med to goal and tolerance -not sure why pt is not on chronic anticoagulation therapy, esp with neoplasm   Anemia of Chronic Disease, Normocytic Normochromic:  -monitor for s/s of acute bleed, none noted this exam; hypotension may improve with blood transfusion;  will transfuse if hbg 7.0 or less or if pt becomes profoundly hemodynamically unstable  Hypothyroidism: -TSH on admission elevated at 8.640, started on Synthroid 56mcg PO daily and recommend recheck in 4 weeks and adjust dose accordingly.  Hyperlipidemia: -continue statin for now and monitor LFTs and CPK prn, also monitor for clinical ADRs associated with statins -no c/o and LFTs wnl to date  Anemia of Chronic Disease, Normocytic Normochromic: -monitor for s/s of acute bleed, none noted this exam  Major Depressive Disorder: -start Lexapro 10mg  daily  Generalized Weakness: -PT/OT eval and tx.  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 Pt now on continuous TFs and Ensure supplements being that she refuses to eat even when directed and assisted. Continue vitamin and protein supplements.  May benefit from appetite stimulant (pt has been on Megace in the past per sister), reassess after resolution of sepsis.  GERD/GI proph: pepcid BID/probiotic VTE proph: heparin 5000U SQ q8h, question need for therapeutic anticoagulation given PVD and malignancy,however pt was not on coumadin or therapeutic lovenox on admission  Code Status: Full Prognosis: Grim   LOS: 13 days   Howis Y Toler

## 2013-11-17 NOTE — Progress Notes (Signed)
Subjective: Pt is alert and oriented pain now controlled well with current medication, otherwise she denies CP, SOA, N/V OR ABD PAIN.    Objective:  Tmax:   98.48F HR:    93-104 RR:    14-20 BP:    94/40-112/52 on Levophed Pulse Ox:   95-97% RA I/O:    2408/4450 BM:   4 BG:    96-114 LINES:  RUA PICC FOLEY:  none, Ileal Conduit   GEN: ill appearing, A&O x 4, in NAD HEENT: PERRLA EOMI bilaterally, MMM, +gag reflex, sclerae anicteric bilaterally NECK: Supple, no LAD or JVD LUNGS: Symmetrical chest rise, diminished bibasilar BS otherwise CTAb CV: Normal S1, S2, no m/c/r ABD: S/NT/ND +BS x 4. No rebound, guarding, or peritoneal signs noted. + PEG tube placement and Ileal Conduit RLQ wth adequate output EXT: No CCE, pulses 2+ NEURO: Grossly intact w/o focal deficits, SMAEs, DTRs 2+ PSYCH: Flat affect  No results found for this or any previous visit (from the past 24 hour(s)).  Studies/Results: Dg Chest Port 1 View  11/04/2013   CLINICAL DATA:  PICC line placement  EXAM: PORTABLE CHEST - 1 VIEW  COMPARISON:  10/20/2013  FINDINGS: Right arm PICC tip in the SVC at the cavoatrial junction in good position.  Bibasilar atelectasis has increased from the prior study. Small pleural effusions. Negative for heart failure.  IMPRESSION: PICC tip at the cavoatrial junction in good position  Increased bibasilar atelectasis and small pleural effusions.   Electronically Signed   By: Franchot Gallo M.D.   On: 11/04/2013 08:03   Dg Abd Portable   11/03/2013   CLINICAL DATA:  Check PEG placement  EXAM: PORTABLE ABDOMEN - 1 VIEW  COMPARISON:  CT abdomen pelvis dated 10/27/2013  FINDINGS: Contrast administered via percutaneous gastrostomy tube opacifies the stomach and proximal small bowel, confirming intraluminal placement.  Bilateral percutaneous nephrostomy catheters.  Nonobstructive bowel gas pattern.  IMPRESSION: Contrast administered via percutaneous gastrostomy tube opacifies the stomach, confirming  intraluminal placement.   Electronically Signed   By: Julian Hy M.D.   On: 11/03/2013 21:55   Assessment/Plan: Sepsis with Severely Resistant UTI with co-infection in bilateral nephrostomy tubes: Hypotension, refractory, likely secondary to Sepsis:  -attempting to wean off Levophed when able -repeat BC x 2 negative to date -ID Consult reinforced grim prognosis and the need to exchange nephrostomy tubes given severely resistant infection if resolution of infection will be possible... Klebsiella Pneumoniae only sensitive to Amikacin/Tetracycline; Stenotrophomonas Maltophilia only sensitive to Levofloxacin/Bactrim.  -Consult IR for nephrostomy tube replacement, hopefully they will see patient tomorrow for evaluation; will need to have family meeting regarding prognosis and ADRs of abx especially if we can not find anyone to remove and replace nephrostomy tubes (i.e. Severe renal failure, CDIFF, etc) -Amikacin d/c by ID today and levels unavailable; this physician concerned for CDIFF, if increase stooling tomorrow would get CDIFF toxin collection and Levaquin Day #4/10-14 days; note, on Stenotrophomonas Maltophilia is sensitive to Levaquin. -continue supportive care  Hypernatremia/Hypokalemia/Hypomagnesium: -resolved, check chemistry prn   End Stage Cervical Cancer/Chronic Pain Syndrome secondary to neoplastic process:  PVD with h/o Pulmonary Embolism: -grave prognosis and pt continues to be resistant to palliative care conversations -titrate pain med to goal and tolerance -not sure why pt is not on chronic anticoagulation therapy, esp with neoplasm   Anemia of Chronic Disease, Normocytic Normochromic:  -monitor for s/s of acute bleed, none noted this exam; hypotension may improve with blood transfusion; will transfuse if hbg 7.0 or less  or if pt becomes profoundly hemodynamically unstable  Hypothyroidism: -continue Synthroid 33mcg PO daily and recommend recheck in 4 weeks and adjust  dose accordingly.  Hyperlipidemia: -continue statin for now and monitor LFTs and CPK prn, also monitor for clinical ADRs associated with statins -no c/o and LFTs wnl to date  Anemia of Chronic Disease, Normocytic Normochromic: -monitor for s/s of acute bleed, none noted this exam  Major Depressive Disorder: -start Lexapro 10mg  daily  Generalized Weakness: -PT/OT eval and tx.  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 Pt now on continuous TFs and Ensure supplements being that she refuses to eat even when directed and assisted. Continue vitamin and protein supplements.  May benefit from appetite stimulant (pt has been on Megace in the past per sister), reassess after resolution of sepsis.  GERD/GI proph: pepcid BID/probiotic VTE proph: heparin 5000U SQ q8h, question need for therapeutic anticoagulation given PVD and malignancy,however pt was not on coumadin or therapeutic lovenox on admission  Code Status: Full Prognosis: Grim   LOS: 14 days   Howis Y Toler

## 2013-11-18 LAB — AMIKACIN, PEAK: Amikacin Pk: 10.3 mg/L — ABNORMAL LOW (ref 20.0–30.0)

## 2013-11-18 LAB — AMIKACIN LEVEL: AMIKACIN RM: 10.8 mg/L

## 2013-11-18 LAB — AMIKACIN, TROUGH: AMIKACIN TR: 11.8 mg/L — AB (ref 4.0–8.0)

## 2013-11-18 NOTE — Progress Notes (Signed)
Subjective: Pt is alert and oriented pain now controlled well with current medication, otherwise she denies CP, SOA, N/V OR ABD PAIN.    Objective:  T                      97.3 HR:    103 RR:    19 BP:    98/64 Pulse Ox:   95% on room air I/O:    -1759 BM:   1 BG:    89-153 LINES:  RUA PICC FOLEY:  none, Ileal Conduit   GEN: ill appearing, A&O x 4, in NAD HEENT: PERRLA EOMI bilaterally, MMM, +gag reflex, sclerae anicteric bilaterally NECK: Supple, no LAD or JVD LUNGS: Symmetrical chest rise, diminished bibasilar BS otherwise CTAb CV: Normal S1, S2, no m/c/r ABD: S/NT/ND +BS x 4. No rebound, guarding, or peritoneal signs noted. + PEG tube placement and Ileal Conduit RLQ wth adequate output EXT: No CCE, pulses 2+ NEURO: Grossly intact w/o focal deficits, SMAEs, DTRs 2+ PSYCH: Flat affect  Results for orders placed during the hospital encounter of 11/03/13 (from the past 24 hour(s))  AMIKACIN, PEAK     Status: Abnormal   Collection Time    11/17/13  6:45 PM      Result Value Ref Range   Amikacin Pk 10.3 (*) 20.0 - 30.0 mg/L    Studies/Results: Dg Chest Port 1 View  11/04/2013   CLINICAL DATA:  PICC line placement  EXAM: PORTABLE CHEST - 1 VIEW  COMPARISON:  10/20/2013  FINDINGS: Right arm PICC tip in the SVC at the cavoatrial junction in good position.  Bibasilar atelectasis has increased from the prior study. Small pleural effusions. Negative for heart failure.  IMPRESSION: PICC tip at the cavoatrial junction in good position  Increased bibasilar atelectasis and small pleural effusions.   Electronically Signed   By: Franchot Gallo M.D.   On: 11/04/2013 08:03   Dg Abd Portable   11/03/2013   CLINICAL DATA:  Check PEG placement  EXAM: PORTABLE ABDOMEN - 1 VIEW  COMPARISON:  CT abdomen pelvis dated 10/27/2013  FINDINGS: Contrast administered via percutaneous gastrostomy tube opacifies the stomach and proximal small bowel, confirming intraluminal placement.  Bilateral percutaneous  nephrostomy catheters.  Nonobstructive bowel gas pattern.  IMPRESSION: Contrast administered via percutaneous gastrostomy tube opacifies the stomach, confirming intraluminal placement.   Electronically Signed   By: Julian Hy M.D.   On: 11/03/2013 21:55   Assessment/Plan: Sepsis with Severely Resistant UTI with co-infection in bilateral nephrostomy tubes: Hypotension, refractory, likely secondary to Sepsis:  -attempting to wean off Levophed when able -ID Consult reinforced grim prognosis and the need to exchange nephrostomy tubes given severely resistant infection if resolution of infection will be possible... Klebsiella Pneumoniae only sensitive to Amikacin/Tetracycline; Stenotrophomonas Maltophilia only sensitive to Levofloxacin/Bactrim.  -continue supportive care  Hypernatremia/Hypokalemia/Hypomagnesium: -resolved, check chemistry prn   End Stage Cervical Cancer/Chronic Pain Syndrome secondary to neoplastic process:  PVD with h/o Pulmonary Embolism: -grave prognosis and pt continues to be resistant to palliative care conversations -titrate pain med to goal and tolerance -not sure why pt is not on chronic anticoagulation therapy, esp with neoplasm   Anemia of Chronic Disease, Normocytic Normochromic:  -monitor for s/s of acute bleed, none noted this exam; hypotension may improve with blood transfusion; will transfuse if hbg 7.0 or less or if pt becomes profoundly hemodynamically unstable  Hypothyroidism: -continue Synthroid 6mcg PO daily and recommend recheck in 4 weeks and adjust dose accordingly.  Hyperlipidemia: -  continue statin for now and monitor LFTs and CPK prn, also monitor for clinical ADRs associated with statins -no c/o and LFTs wnl to date  Anemia of Chronic Disease, Normocytic Normochromic: -monitor for s/s of acute bleed, none noted this exam  Major Depressive Disorder: -start Lexapro 10mg  daily  Generalized Weakness: -PT/OT eval and tx.  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 Pt now on continuous TFs and Ensure supplements being that she refuses to eat even when directed and assisted. Continue vitamin and protein supplements.  May benefit from appetite stimulant (pt has been on Megace in the past per sister), reassess after resolution of sepsis.  GERD/GI proph: pepcid BID/probiotic VTE proph: heparin 5000U SQ q8h, question need for therapeutic anticoagulation given PVD and malignancy,however pt was not on coumadin or therapeutic lovenox on admission  Code Status: Full Prognosis: Grim   LOS: 15 days   Merton Border

## 2013-11-19 NOTE — Progress Notes (Signed)
Subjective: Pt is alert and oriented pain now controlled well with current medication, otherwise she denies CP, SOA, N/V OR ABD PAIN.    Objective:  Tmax:   98.75F HR:    84-100 RR:    14-20 BP:    96/44-120/62 on Levophed Pulse Ox:   95-97% RA BM:   2 BG:    100-118 LINES:  RUA PICC FOLEY:  none, Ileal Conduit   GEN: ill appearing, A&O x 4, in NAD HEENT: PERRLA EOMI bilaterally, MMM, +gag reflex, sclerae anicteric bilaterally NECK: Supple, no LAD or JVD LUNGS: Symmetrical chest rise, diminished bibasilar BS otherwise CTAb CV: Normal S1, S2, no m/c/r ABD: S/NT/ND +BS x 4. No rebound, guarding, or peritoneal signs noted. + PEG tube placement and Ileal Conduit RLQ wth adequate output EXT: No CCE, pulses 2+ NEURO: Grossly intact w/o focal deficits, SMAEs, DTRs 2+ PSYCH: Flat affect  No results found for this or any previous visit (from the past 24 hour(s)).  Studies/Results: Dg Chest Port 1 View  11/04/2013   CLINICAL DATA:  PICC line placement  EXAM: PORTABLE CHEST - 1 VIEW  COMPARISON:  10/20/2013  FINDINGS: Right arm PICC tip in the SVC at the cavoatrial junction in good position.  Bibasilar atelectasis has increased from the prior study. Small pleural effusions. Negative for heart failure.  IMPRESSION: PICC tip at the cavoatrial junction in good position  Increased bibasilar atelectasis and small pleural effusions.   Electronically Signed   By: Franchot Gallo M.D.   On: 11/04/2013 08:03   Dg Abd Portable   11/03/2013   CLINICAL DATA:  Check PEG placement  EXAM: PORTABLE ABDOMEN - 1 VIEW  COMPARISON:  CT abdomen pelvis dated 10/27/2013  FINDINGS: Contrast administered via percutaneous gastrostomy tube opacifies the stomach and proximal small bowel, confirming intraluminal placement.  Bilateral percutaneous nephrostomy catheters.  Nonobstructive bowel gas pattern.  IMPRESSION: Contrast administered via percutaneous gastrostomy tube opacifies the stomach, confirming intraluminal  placement.   Electronically Signed   By: Julian Hy M.D.   On: 11/03/2013 21:55   Assessment/Plan: Sepsis with Severely Resistant UTI with co-infection in bilateral nephrostomy tubes: Hypotension, refractory, likely secondary to Sepsis:  -wean off Levophed and start NS infusion, SBP>80 -Klebsiella Pneumoniae and Stenotrophomonas Maltophilia ID to manage abx -Pt not a candidate for nephrostomy tube exchange, long d/w pt regarding refractory response to abx tx as a result. Also expressed to pt that she can not remain on levophed indefinitely, attempts again made to discuss hospice with pt; pt stated she did not want to discuss at this time and come back later.  -continue supportive care  End Stage Cervical Cancer/Chronic Pain Syndrome secondary to neoplastic process:  PVD with h/o Pulmonary Embolism: -grave prognosis and pt continues to be resistant to palliative care conversations -titrate pain med to goal and tolerance   Anemia of Chronic Disease, Normocytic Normochromic:  -monitor for s/s of acute bleed, none noted this exam; hypotension may improve with blood transfusion; will transfuse if hbg 7.0 or less or if pt becomes profoundly hemodynamically unstable  Hypothyroidism: -continue Synthroid 85mcg PO daily and recommend recheck in 4 weeks and adjust dose accordingly.  Hyperlipidemia: -continue statin for now and monitor LFTs and CPK prn, also monitor for clinical ADRs associated with statins -no c/o and LFTs wnl to date  Anemia of Chronic Disease, Normocytic Normochromic: -monitor for s/s of acute bleed, none noted this exam  Major Depressive Disorder: -cont Lexapro 10mg  daily  Generalized Weakness: -PT/OT eval and  tx.  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 Pt now on continuous TFs and Ensure supplements being  that she refuses to eat even when directed and assisted. Continue vitamin and protein supplements.  May benefit from appetite stimulant (pt has been on Megace in the past per sister), reassess after resolution of sepsis.  GERD/GI proph: pepcid BID/probiotic VTE proph: heparin 5000U SQ q8h,  Code Status: Full Prognosis: Magnus Sinning Y Toler 11/19/2013 at 1:55 PM

## 2013-11-22 LAB — CBC
HEMATOCRIT: 25.8 % — AB (ref 36.0–46.0)
Hemoglobin: 7.7 g/dL — ABNORMAL LOW (ref 12.0–15.0)
MCH: 28.1 pg (ref 26.0–34.0)
MCHC: 29.8 g/dL — AB (ref 30.0–36.0)
MCV: 94.2 fL (ref 78.0–100.0)
Platelets: 554 10*3/uL — ABNORMAL HIGH (ref 150–400)
RBC: 2.74 MIL/uL — ABNORMAL LOW (ref 3.87–5.11)
RDW: 19.9 % — ABNORMAL HIGH (ref 11.5–15.5)
WBC: 8.7 10*3/uL (ref 4.0–10.5)

## 2013-11-23 LAB — BASIC METABOLIC PANEL
ANION GAP: 11 (ref 5–15)
BUN: 31 mg/dL — ABNORMAL HIGH (ref 6–23)
CALCIUM: 8.6 mg/dL (ref 8.4–10.5)
CO2: 18 mEq/L — ABNORMAL LOW (ref 19–32)
CREATININE: 0.71 mg/dL (ref 0.50–1.10)
Chloride: 111 mEq/L (ref 96–112)
GFR calc Af Amer: >90 mL/min (ref >90)
GFR, EST NON AFRICAN AMERICAN: 87 mL/min — AB (ref >90)
Glucose, Bld: 87 mg/dL (ref 70–99)
Potassium: 5.5 mEq/L — ABNORMAL HIGH (ref 3.7–5.3)
SODIUM: 140 meq/L (ref 137–147)

## 2013-11-23 NOTE — Progress Notes (Signed)
Subjective: Pt is alert and oriented pain now controlled well with current medication, otherwise she denies CP, SOA, N/V OR ABD PAIN.    Objective:  Tmax:   98.42F HR:    60-85 RR:    14-20 BP:    78/60-98/62 Pulse Ox:   96-98% RA I/O:    4717/1900 BM:   2 BG:    116-121 LINES:  RUA PICC FOLEY:  none, Ileal Conduit   GEN: ill appearing, A&O x 4, in NAD HEENT: PERRLA EOMI bilaterally, MMM, +gag reflex, sclerae anicteric bilaterally NECK: Supple, no LAD or JVD LUNGS: Symmetrical chest rise, diminished bibasilar BS otherwise CTAb CV: Normal S1, S2, no m/c/r ABD: S/NT/ND +BS x 4. No rebound, guarding, or peritoneal signs noted. + PEG tube placement and Ileal Conduit RLQ wth adequate output EXT: No CCE, pulses 2+ NEURO: Grossly intact w/o focal deficits, SMAEs, DTRs 2+ PSYCH: Flat affect  Results for orders placed during the hospital encounter of 11/03/13 (from the past 24 hour(s))  BASIC METABOLIC PANEL     Status: Abnormal   Collection Time    11/22/13 11:03 PM      Result Value Ref Range   Sodium 140  137 - 147 mEq/L   Potassium 5.5 (*) 3.7 - 5.3 mEq/L   Chloride 111  96 - 112 mEq/L   CO2 18 (*) 19 - 32 mEq/L   Glucose, Bld 87  70 - 99 mg/dL   BUN 31 (*) 6 - 23 mg/dL   Creatinine, Ser 0.71  0.50 - 1.10 mg/dL   Calcium 8.6  8.4 - 10.5 mg/dL   GFR calc non Af Amer 87 (*) >90 mL/min   GFR calc Af Amer >90  >90 mL/min   Anion gap 11  5 - 15  CBC     Status: Abnormal   Collection Time    11/22/13 11:03 PM      Result Value Ref Range   WBC 8.7  4.0 - 10.5 K/uL   RBC 2.74 (*) 3.87 - 5.11 MIL/uL   Hemoglobin 7.7 (*) 12.0 - 15.0 g/dL   HCT 25.8 (*) 36.0 - 46.0 %   MCV 94.2  78.0 - 100.0 fL   MCH 28.1  26.0 - 34.0 pg   MCHC 29.8 (*) 30.0 - 36.0 g/dL   RDW 19.9 (*) 11.5 - 15.5 %   Platelets 554 (*) 150 - 400 K/uL    Studies/Results: Dg Chest Port 1 View  11/04/2013   CLINICAL DATA:  PICC line placement  EXAM: PORTABLE CHEST - 1 VIEW  COMPARISON:  10/20/2013  FINDINGS:  Right arm PICC tip in the SVC at the cavoatrial junction in good position.  Bibasilar atelectasis has increased from the prior study. Small pleural effusions. Negative for heart failure.  IMPRESSION: PICC tip at the cavoatrial junction in good position  Increased bibasilar atelectasis and small pleural effusions.   Electronically Signed   By: Franchot Gallo M.D.   On: 11/04/2013 08:03   Dg Abd Portable   11/03/2013   CLINICAL DATA:  Check PEG placement  EXAM: PORTABLE ABDOMEN - 1 VIEW  COMPARISON:  CT abdomen pelvis dated 10/27/2013  FINDINGS: Contrast administered via percutaneous gastrostomy tube opacifies the stomach and proximal small bowel, confirming intraluminal placement.  Bilateral percutaneous nephrostomy catheters.  Nonobstructive bowel gas pattern.  IMPRESSION: Contrast administered via percutaneous gastrostomy tube opacifies the stomach, confirming intraluminal placement.   Electronically Signed   By: Julian Hy M.D.   On: 11/03/2013 21:55  Assessment/Plan: Sepsis with Severely Resistant UTI with co-infection in bilateral nephrostomy tubes: Hypotension, refractory, likely secondary to Sepsis:  -levophed has been weaned to off and patient maintained on NS tra 185ml/h -ID Consult reinforced grim prognosis and the need to exchange nephrostomy tubes given severely resistant infection if resolution of infection will be possible... Klebsiella Pneumoniae and Stenotrophomonas Maltophilia. Per ID she is now on: Primaxin, Tygacil, and Meropenem -continue supportive care  Hypernatremia/Hypokalemia/Hypomagnesium: -resolved   End Stage Cervical Cancer/Chronic Pain Syndrome secondary to neoplastic process:  PVD with h/o Pulmonary Embolism: -grave prognosis and pt continues to be resistant to palliative care conversations -titrate pain med to goal and tolerance -not sure why pt is not on chronic anticoagulation therapy, esp with neoplasm   Anemia of Chronic Disease, Normocytic Normochromic:   -monitor for s/s of acute bleed, none noted this exam; hypotension may improve with blood transfusion; will transfuse if hbg 7.0 or less or if pt becomes profoundly hemodynamically unstable  Hypothyroidism: -continue Synthroid 61mcg PO daily and recommend recheck in 4 weeks and adjust dose accordingly.  Hyperlipidemia: -continue statin for now and monitor LFTs and CPK prn, also monitor for clinical ADRs associated with statins -no c/o and LFTs wnl to date  Anemia of Chronic Disease, Normocytic Normochromic: -monitor for s/s of acute bleed, none noted this exam  Major Depressive Disorder: -cont Lexapro 10mg  daily  Generalized Weakness: -PT/OT eval and tx.  Pt is amotivated, may improve with SSRI tx.  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 Cont TFs and Ensure supplements being that she continues to refuse to eat even when directed and assisted. Continue vitamin and protein supplements.  May benefit from appetite stimulant (pt has been on Megace in the past per sister).  GERD/GI proph: pepcid BID/probiotic VTE proph: heparin 5000U SQ q8h  Code Status: Full Prognosis: Grim  Family Communication:  Long d/w pt regarding discharge planning, pt states she wants to go home and not to SNF. I explained to her if she goes home she will ned to go home with hospice.  She paused and then stated, she would just have to go home with hospice. Will d/w case mgmt for coordination of care.    Howis Y Toler 11/22/2013 at 11:25 AM  ivf stop levo

## 2013-11-23 NOTE — Progress Notes (Addendum)
Subjective: Pt is alert and oriented pain now controlled well with current medication, otherwise she denies CP, SOA, N/V OR ABD PAIN.    Objective:  T                98.2 HR:      61 RR:    19 BP:    86/45-102/71 Pulse Ox:   96-98% RA I/O:    -975 BM:   2 BG:    88-103 LINES:  RUA PICC FOLEY:  none, Ileal Conduit   GEN: chronic ill appearing, A&O x 4, in NAD HEENT: PERRLA EOMI bilaterally, MMM, +gag reflex, sclerae anicteric bilaterally NECK: Supple, no LAD or JVD LUNGS: Symmetrical chest rise, diminished bibasilar BS otherwise CTAb CV: Normal S1, S2, no m/c/r ABD: S/NT/ND +BS x 4. No rebound, guarding, or peritoneal signs noted. + PEG tube placement and Ileal Conduit RLQ wth adequate output EXT: No CCE, pulses 2+ NEURO: Grossly intact w/o focal deficits, SMAEs, DTRs 2+ PSYCH: Flat affect  Results for orders placed during the hospital encounter of 11/03/13 (from the past 24 hour(s))  BASIC METABOLIC PANEL     Status: Abnormal   Collection Time    11/22/13 11:03 PM      Result Value Ref Range   Sodium 140  137 - 147 mEq/L   Potassium 5.5 (*) 3.7 - 5.3 mEq/L   Chloride 111  96 - 112 mEq/L   CO2 18 (*) 19 - 32 mEq/L   Glucose, Bld 87  70 - 99 mg/dL   BUN 31 (*) 6 - 23 mg/dL   Creatinine, Ser 0.71  0.50 - 1.10 mg/dL   Calcium 8.6  8.4 - 10.5 mg/dL   GFR calc non Af Amer 87 (*) >90 mL/min   GFR calc Af Amer >90  >90 mL/min   Anion gap 11  5 - 15  CBC     Status: Abnormal   Collection Time    11/22/13 11:03 PM      Result Value Ref Range   WBC 8.7  4.0 - 10.5 K/uL   RBC 2.74 (*) 3.87 - 5.11 MIL/uL   Hemoglobin 7.7 (*) 12.0 - 15.0 g/dL   HCT 25.8 (*) 36.0 - 46.0 %   MCV 94.2  78.0 - 100.0 fL   MCH 28.1  26.0 - 34.0 pg   MCHC 29.8 (*) 30.0 - 36.0 g/dL   RDW 19.9 (*) 11.5 - 15.5 %   Platelets 554 (*) 150 - 400 K/uL    Studies/Results: Dg Chest Port 1 View  11/04/2013   CLINICAL DATA:  PICC line placement  EXAM: PORTABLE CHEST - 1 VIEW  COMPARISON:  10/20/2013   FINDINGS: Right arm PICC tip in the SVC at the cavoatrial junction in good position.  Bibasilar atelectasis has increased from the prior study. Small pleural effusions. Negative for heart failure.  IMPRESSION: PICC tip at the cavoatrial junction in good position  Increased bibasilar atelectasis and small pleural effusions.   Electronically Signed   By: Franchot Gallo M.D.   On: 11/04/2013 08:03   Dg Abd Portable   11/03/2013   CLINICAL DATA:  Check PEG placement  EXAM: PORTABLE ABDOMEN - 1 VIEW  COMPARISON:  CT abdomen pelvis dated 10/27/2013  FINDINGS: Contrast administered via percutaneous gastrostomy tube opacifies the stomach and proximal small bowel, confirming intraluminal placement.  Bilateral percutaneous nephrostomy catheters.  Nonobstructive bowel gas pattern.  IMPRESSION: Contrast administered via percutaneous gastrostomy tube opacifies the stomach, confirming intraluminal placement.  Electronically Signed   By: Julian Hy M.D.   On: 11/03/2013 21:55   Assessment/Plan: Sepsis with Severely Resistant UTI with co-infection in bilateral nephrostomy tubes: Hypotension, refractory, likely secondary to Sepsis:  -levophed has been weaned to off and patient maintained on NS tra 167ml/h -ID Consult reinforced grim prognosis and the need to exchange nephrostomy tubes given severely resistant infection if resolution of infection will be possible... Klebsiella Pneumoniae and Stenotrophomonas Maltophilia. Per ID she is now on: Primaxin, Tygacil, and Meropenem -continue supportive care  Hypernatremia/Hypokalemia/Hypomagnesium: -resolved   End Stage Cervical Cancer/Chronic Pain Syndrome secondary to neoplastic process:  PVD with h/o Pulmonary Embolism: -grave prognosis and pt continues to be resistant to palliative care conversations -titrate pain med to goal and tolerance -not sure why pt is not on chronic anticoagulation therapy, esp with neoplasm   Anemia of Chronic Disease, Normocytic  Normochromic:  -monitor for s/s of acute bleed, none noted this exam; hypotension may improve with blood transfusion;   Hypothyroidism: -continue Synthroid 62mcg PO daily and recommend recheck in 4 weeks and adjust dose accordingly.  Hyperlipidemia: -continue statin for now and monitor LFTs and CPK prn, also monitor for clinical ADRs associated with statins -no c/o and LFTs wnl to date  Anemia of Chronic Disease, Normocytic Normochromic: -monitor for s/s of acute bleed, none noted this exam  Major Depressive Disorder: -cont Lexapro 10mg  daily  Generalized Weakness: -PT/OT eval and tx.  Pt is amotivated, may improve with SSRI tx.  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 Cont regular food and thin liquids  GERD/GI proph: pepcid BID/probiotic VTE proph: heparin 5000U SQ q8h  Code Status: Full Prognosis: Grim  Probable discharge in a.m.   Merton Border 11/22/2013 at 11:25 AM  ivf stop levo

## 2013-11-23 NOTE — Progress Notes (Signed)
Subjective: Pt is alert and oriented pain now controlled well with current medication, she wants to go home not to SNF. Objective:  Tmax:   99.66F HR:    93-97 RR:    18 BP:    81/53-96/61  Pulse Ox:   95-98% RA BM:   0 BG:    112-130 LINES:  RUA PICC FOLEY:  none, Ileal Conduit   GEN: ill appearing, A&O x 4, in NAD HEENT: PERRLA EOMI bilaterally, MMM, +gag reflex, sclerae anicteric bilaterally NECK: Supple, no LAD or JVD LUNGS: Symmetrical chest rise, diminished bibasilar BS otherwise CTAb CV: Normal S1, S2, no m/c/r ABD: S/NT/ND +BS x 4. No rebound, guarding, or peritoneal signs noted. + PEG tube placement and Ileal Conduit RLQ wth adequate output EXT: No CCE, pulses 2+ NEURO: Grossly intact w/o focal deficits, SMAEs, DTRs 2+ PSYCH: Flat affect  Results for orders placed during the hospital encounter of 11/03/13 (from the past 24 hour(s))  BASIC METABOLIC PANEL     Status: Abnormal   Collection Time    11/22/13 11:03 PM      Result Value Ref Range   Sodium 140  137 - 147 mEq/L   Potassium 5.5 (*) 3.7 - 5.3 mEq/L   Chloride 111  96 - 112 mEq/L   CO2 18 (*) 19 - 32 mEq/L   Glucose, Bld 87  70 - 99 mg/dL   BUN 31 (*) 6 - 23 mg/dL   Creatinine, Ser 0.71  0.50 - 1.10 mg/dL   Calcium 8.6  8.4 - 10.5 mg/dL   GFR calc non Af Amer 87 (*) >90 mL/min   GFR calc Af Amer >90  >90 mL/min   Anion gap 11  5 - 15  CBC     Status: Abnormal   Collection Time    11/22/13 11:03 PM      Result Value Ref Range   WBC 8.7  4.0 - 10.5 K/uL   RBC 2.74 (*) 3.87 - 5.11 MIL/uL   Hemoglobin 7.7 (*) 12.0 - 15.0 g/dL   HCT 25.8 (*) 36.0 - 46.0 %   MCV 94.2  78.0 - 100.0 fL   MCH 28.1  26.0 - 34.0 pg   MCHC 29.8 (*) 30.0 - 36.0 g/dL   RDW 19.9 (*) 11.5 - 15.5 %   Platelets 554 (*) 150 - 400 K/uL    Studies/Results: Dg Chest Port 1 View  11/04/2013   CLINICAL DATA:  PICC line placement  EXAM: PORTABLE CHEST - 1 VIEW  COMPARISON:  10/20/2013  FINDINGS: Right arm PICC tip in the SVC at the  cavoatrial junction in good position.  Bibasilar atelectasis has increased from the prior study. Small pleural effusions. Negative for heart failure.  IMPRESSION: PICC tip at the cavoatrial junction in good position  Increased bibasilar atelectasis and small pleural effusions.   Electronically Signed   By: Franchot Gallo M.D.   On: 11/04/2013 08:03   Dg Abd Portable   11/03/2013   CLINICAL DATA:  Check PEG placement  EXAM: PORTABLE ABDOMEN - 1 VIEW  COMPARISON:  CT abdomen pelvis dated 10/27/2013  FINDINGS: Contrast administered via percutaneous gastrostomy tube opacifies the stomach and proximal small bowel, confirming intraluminal placement.  Bilateral percutaneous nephrostomy catheters.  Nonobstructive bowel gas pattern.  IMPRESSION: Contrast administered via percutaneous gastrostomy tube opacifies the stomach, confirming intraluminal placement.   Electronically Signed   By: Julian Hy M.D.   On: 11/03/2013 21:55   Assessment/Plan: Sepsis with Severely Resistant UTI  with co-infection in bilateral nephrostomy tubes: Hypotension, refractory, likely secondary to Sepsis:  -wean off Levophed and start NS infusion, SBP>80 -Klebsiella Pneumoniae and Stenotrophomonas Maltophilia ID to manage abx -Pt not a candidate for nephrostomy tube exchange, long d/w pt regarding refractory response to abx tx as a result. Also expressed to pt that she can not remain on levophed indefinitely, attempts again made to discuss hospice with pt; pt stated she did not want to discuss at this time and come back later.  -continue supportive care  End Stage Cervical Cancer/Chronic Pain Syndrome secondary to neoplastic process:  PVD with h/o Pulmonary Embolism: -grave prognosis and pt continues to be resistant to palliative care conversations -titrate pain med to goal and tolerance   Anemia of Chronic Disease, Normocytic Normochromic:  -monitor for s/s of acute bleed, none noted this exam; hypotension may improve with  blood transfusion; will transfuse if hbg 7.0 or less or if pt becomes profoundly hemodynamically unstable  Hypothyroidism: -continue Synthroid 60mcg PO daily and recommend recheck in 4 weeks and adjust dose accordingly.  Hyperlipidemia: -continue statin for now and monitor LFTs and CPK prn, also monitor for clinical ADRs associated with statins -no c/o and LFTs wnl to date  Anemia of Chronic Disease, Normocytic Normochromic: -monitor for s/s of acute bleed, none noted this exam  Major Depressive Disorder: -cont Lexapro 10mg  daily  Generalized Weakness: -PT/OT eval and tx.  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 Pt now on continuous TFs and Ensure supplements being that she refuses to eat even when directed and assisted. Continue vitamin and protein supplements.  May benefit from appetite stimulant (pt has been on Megace in the past per sister), reassess after resolution of sepsis.  GERD/GI proph: pepcid BID/probiotic VTE proph: heparin 5000U SQ q8h,  Code Status: Full Prognosis: Magnus Sinning Y Toler 11/19/2013 at 1:55 PM

## 2013-11-23 NOTE — Progress Notes (Signed)
Subjective: Pt is alert and oriented pain now controlled well with current medication, she wants to go home not to SNF. Objective:  Tmax:   98.30F HR:    78-90 RR:    14-16 BP:    90/57-97/56 Pulse Ox:   95-98% RA BM:   2 BG:    114-150 I/O:  3208/4200 LINES:  RUA PICC FOLEY:  none, Ileal Conduit   GEN: ill appearing, A&O x 4, in NAD HEENT: PERRLA EOMI bilaterally, MMM, +gag reflex, sclerae anicteric bilaterally NECK: Supple, no LAD or JVD LUNGS: Symmetrical chest rise, diminished bibasilar BS otherwise CTAb CV: Normal S1, S2, no m/c/r ABD: S/NT/ND +BS x 4. No rebound, guarding, or peritoneal signs noted. + PEG tube placement and Ileal Conduit RLQ wth adequate output EXT: No CCE, pulses 2+ NEURO: Grossly intact w/o focal deficits, SMAEs, DTRs 2+ PSYCH: Flat affect  Results for orders placed during the hospital encounter of 11/03/13 (from the past 24 hour(s))  BASIC METABOLIC PANEL     Status: Abnormal   Collection Time    11/22/13 11:03 PM      Result Value Ref Range   Sodium 140  137 - 147 mEq/L   Potassium 5.5 (*) 3.7 - 5.3 mEq/L   Chloride 111  96 - 112 mEq/L   CO2 18 (*) 19 - 32 mEq/L   Glucose, Bld 87  70 - 99 mg/dL   BUN 31 (*) 6 - 23 mg/dL   Creatinine, Ser 0.71  0.50 - 1.10 mg/dL   Calcium 8.6  8.4 - 10.5 mg/dL   GFR calc non Af Amer 87 (*) >90 mL/min   GFR calc Af Amer >90  >90 mL/min   Anion gap 11  5 - 15  CBC     Status: Abnormal   Collection Time    11/22/13 11:03 PM      Result Value Ref Range   WBC 8.7  4.0 - 10.5 K/uL   RBC 2.74 (*) 3.87 - 5.11 MIL/uL   Hemoglobin 7.7 (*) 12.0 - 15.0 g/dL   HCT 25.8 (*) 36.0 - 46.0 %   MCV 94.2  78.0 - 100.0 fL   MCH 28.1  26.0 - 34.0 pg   MCHC 29.8 (*) 30.0 - 36.0 g/dL   RDW 19.9 (*) 11.5 - 15.5 %   Platelets 554 (*) 150 - 400 K/uL    Studies/Results: Dg Chest Port 1 View  11/04/2013   CLINICAL DATA:  PICC line placement  EXAM: PORTABLE CHEST - 1 VIEW  COMPARISON:  10/20/2013  FINDINGS: Right arm PICC tip in  the SVC at the cavoatrial junction in good position.  Bibasilar atelectasis has increased from the prior study. Small pleural effusions. Negative for heart failure.  IMPRESSION: PICC tip at the cavoatrial junction in good position  Increased bibasilar atelectasis and small pleural effusions.   Electronically Signed   By: Franchot Gallo M.D.   On: 11/04/2013 08:03   Dg Abd Portable   11/03/2013   CLINICAL DATA:  Check PEG placement  EXAM: PORTABLE ABDOMEN - 1 VIEW  COMPARISON:  CT abdomen pelvis dated 10/27/2013  FINDINGS: Contrast administered via percutaneous gastrostomy tube opacifies the stomach and proximal small bowel, confirming intraluminal placement.  Bilateral percutaneous nephrostomy catheters.  Nonobstructive bowel gas pattern.  IMPRESSION: Contrast administered via percutaneous gastrostomy tube opacifies the stomach, confirming intraluminal placement.   Electronically Signed   By: Julian Hy M.D.   On: 11/03/2013 21:55   Assessment/Plan: Sepsis with Severely  Resistant UTI with co-infection in bilateral nephrostomy tubes: Hypotension, refractory, likely secondary to Sepsis:  -wean off Levophed and start NS infusion, SBP>80 -Klebsiella Pneumoniae and Stenotrophomonas Maltophilia ID to manage abx -Pt not a candidate for nephrostomy tube exchange, long d/w pt regarding refractory response to abx tx as a result. Also expressed to pt that she can not remain on levophed indefinitely, attempts again made to discuss hospice with pt; pt stated she did not want to discuss at this time and come back later.  -continue supportive care  End Stage Cervical Cancer/Chronic Pain Syndrome secondary to neoplastic process:  PVD with h/o Pulmonary Embolism: -grave prognosis and pt continues to be resistant to palliative care conversations -titrate pain med to goal and tolerance   Anemia of Chronic Disease, Normocytic Normochromic:  -monitor for s/s of acute bleed, none noted this exam; hypotension may  improve with blood transfusion; will transfuse if hbg 7.0 or less or if pt becomes profoundly hemodynamically unstable  Hypothyroidism: -continue Synthroid 55mcg PO daily and recommend recheck in 4 weeks and adjust dose accordingly.  Hyperlipidemia: -continue statin for now and monitor LFTs and CPK prn, also monitor for clinical ADRs associated with statins -no c/o and LFTs wnl to date  Anemia of Chronic Disease, Normocytic Normochromic: -monitor for s/s of acute bleed, none noted this exam  Major Depressive Disorder: -cont Lexapro 10mg  daily  Generalized Weakness: -PT/OT eval and tx.  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 Pt now on continuous TFs and Ensure supplements being that she refuses to eat even when directed and assisted. Continue vitamin and protein supplements.  May benefit from appetite stimulant (pt has been on Megace in the past per sister), reassess after resolution of sepsis.  GERD/GI proph: pepcid BID/probiotic VTE proph: heparin 5000U SQ q8h,  Code Status: Full Prognosis: Ellan Lambert Toler 11/21/2013 at 12:58 PM

## 2014-10-27 DEATH — deceased

## 2015-01-27 IMAGING — CR DG CHEST 1V PORT
1 series · 1 of 1 positions shown · non-contrast
Comparison: 11/09/2013 .

CLINICAL DATA: Fever.

EXAM:
PORTABLE CHEST - 1 VIEW

[AP]
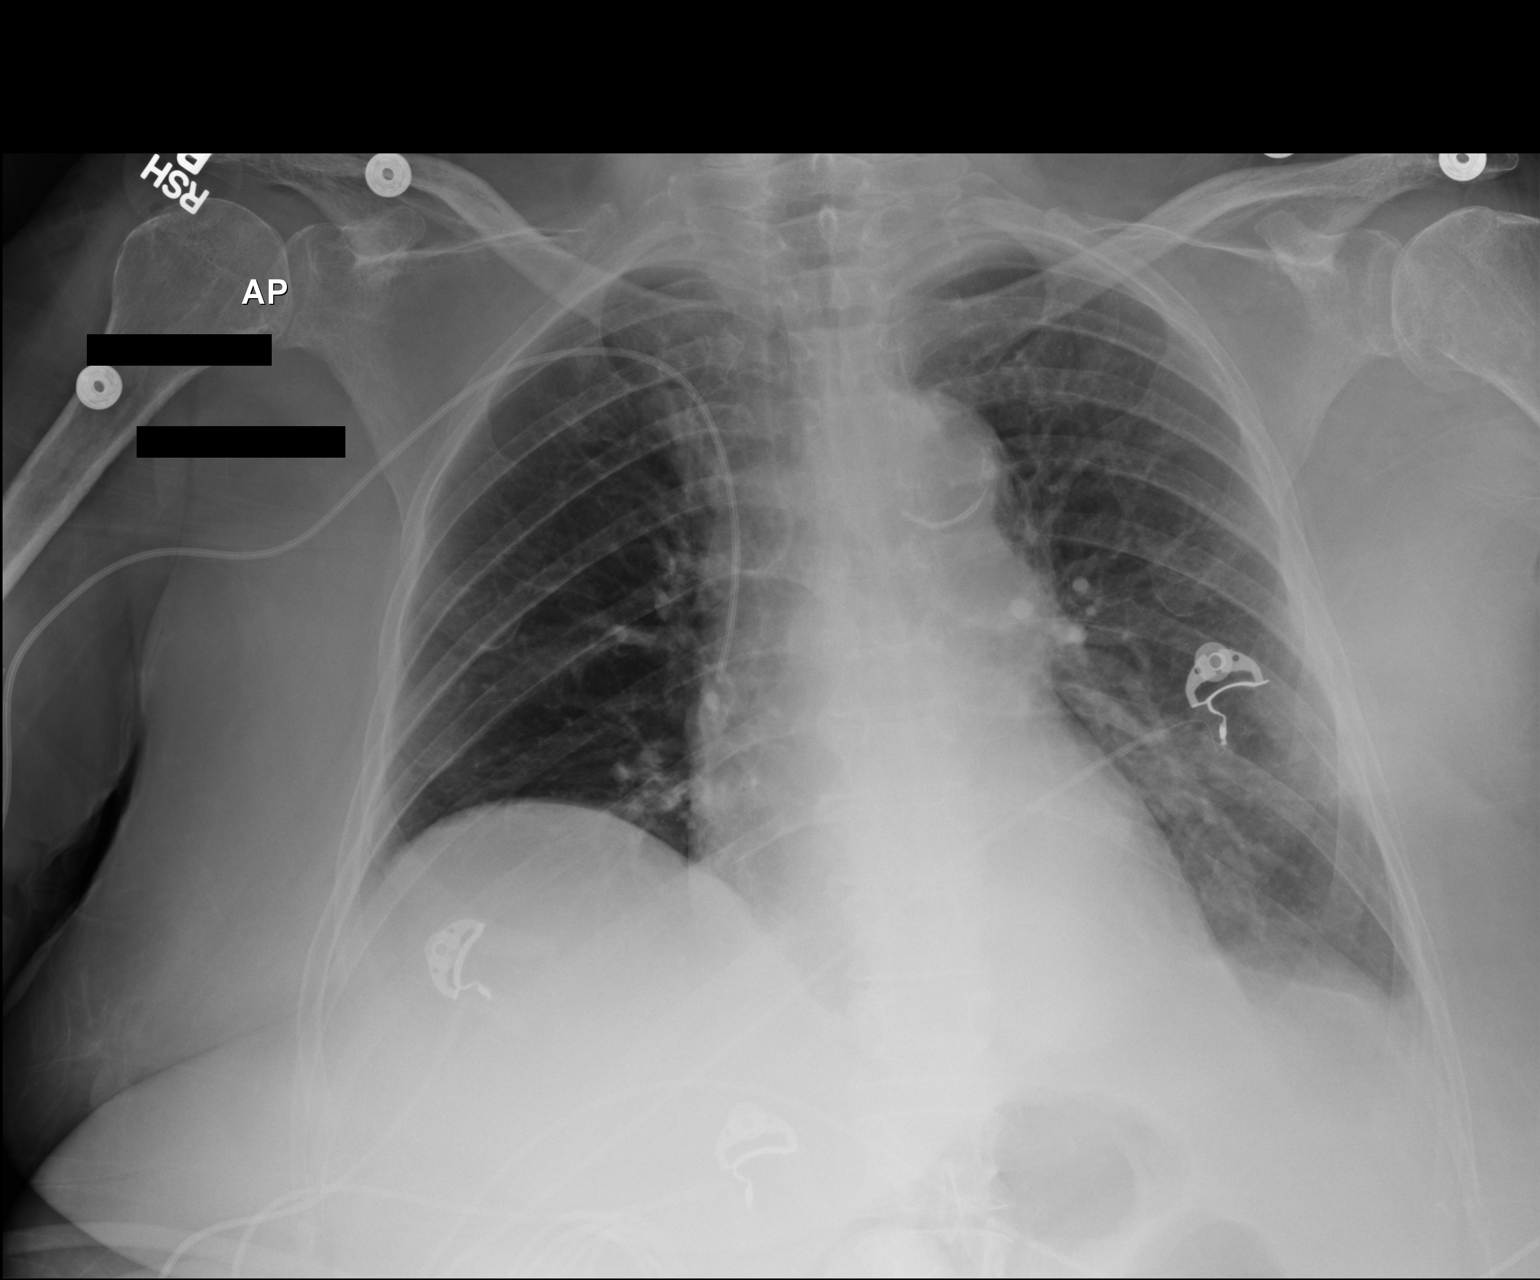

[1 of 1 positions shown; findings below may reference images not displayed]

FINDINGS: PICC line in good anatomic position. Mediastinum and hilar
structures are stable. Heart size stable. Normal pulmonary
vascularity. Persistent left lower lobe infiltrate. Small left
pleural effusion. No pneumothorax. No acute bony abnormality .
IMPRESSION: 1. PICC line in stable position.
2. Persistent left lower lobe infiltrate with small left pleural
effusion.
